# Patient Record
Sex: Female | Born: 1990
Health system: Southern US, Community
[De-identification: ages and names within clinical notes are randomized; demographics above are authoritative.]

## PROBLEM LIST (undated history)

## (undated) ENCOUNTER — Inpatient Hospital Stay: Payer: Self-pay

## (undated) DIAGNOSIS — D649 Anemia, unspecified: Secondary | ICD-10-CM

## (undated) HISTORY — PX: GALLBLADDER SURGERY: SHX652

---
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
## 2008-03-25 ENCOUNTER — Ambulatory Visit: Payer: Self-pay

## 2008-03-26 ENCOUNTER — Other Ambulatory Visit: Payer: Self-pay

## 2008-03-26 ENCOUNTER — Inpatient Hospital Stay: Payer: Self-pay

## 2009-01-27 ENCOUNTER — Emergency Department: Payer: Self-pay | Admitting: Emergency Medicine

## 2009-12-31 ENCOUNTER — Emergency Department: Payer: Self-pay | Admitting: Emergency Medicine

## 2012-04-23 ENCOUNTER — Emergency Department: Payer: Self-pay | Admitting: *Deleted

## 2012-04-23 LAB — COMPREHENSIVE METABOLIC PANEL
Alkaline Phosphatase: 36 U/L — ABNORMAL LOW (ref 50–136)
BUN: 17 mg/dL (ref 7–18)
Chloride: 106 mmol/L (ref 98–107)
Creatinine: 0.73 mg/dL (ref 0.60–1.30)
EGFR (African American): >60
EGFR (Non-African Amer.): >60
Glucose: 85 mg/dL (ref 65–99)
SGOT(AST): 14 U/L — ABNORMAL LOW (ref 15–37)
SGPT (ALT): 11 U/L — ABNORMAL LOW (ref 12–78)
Total Protein: 7.8 g/dL (ref 6.4–8.2)

## 2012-04-23 LAB — URINALYSIS, COMPLETE
Bacteria: NONE SEEN
Bilirubin,UR: NEGATIVE
Blood: NEGATIVE
Glucose,UR: NEGATIVE mg/dL (ref 0–75)
Leukocyte Esterase: NEGATIVE
Nitrite: NEGATIVE
Protein: NEGATIVE
RBC,UR: <1 /HPF (ref 0–5)
Squamous Epithelial: 1
WBC UR: 1 /HPF (ref 0–5)

## 2012-04-23 LAB — CBC
HGB: 12.4 g/dL (ref 12.0–16.0)
RBC: 4.2 10*6/uL (ref 3.80–5.20)
RDW: 12.5 % (ref 11.5–14.5)
WBC: 9.7 10*3/uL (ref 3.6–11.0)

## 2013-12-30 ENCOUNTER — Emergency Department: Payer: Self-pay | Admitting: Emergency Medicine

## 2014-09-06 ENCOUNTER — Emergency Department: Payer: Self-pay | Admitting: Internal Medicine

## 2015-04-28 LAB — OB RESULTS CONSOLE HIV ANTIBODY (ROUTINE TESTING): HIV: NONREACTIVE

## 2015-04-28 LAB — OB RESULTS CONSOLE RUBELLA ANTIBODY, IGM: Rubella: IMMUNE

## 2015-04-28 LAB — OB RESULTS CONSOLE HEPATITIS B SURFACE ANTIGEN: HEP B S AG: NEGATIVE

## 2015-04-28 LAB — OB RESULTS CONSOLE RPR: RPR: NONREACTIVE

## 2015-04-28 LAB — OB RESULTS CONSOLE ABO/RH: RH Type: POSITIVE

## 2015-04-28 LAB — OB RESULTS CONSOLE VARICELLA ZOSTER ANTIBODY, IGG: VARICELLA IGG: IMMUNE

## 2015-05-06 ENCOUNTER — Ambulatory Visit
Admission: RE | Admit: 2015-05-06 | Discharge: 2015-05-06 | Disposition: A | Discharge status: Home / Self Care | Payer: 59 | Source: Ambulatory Visit | Attending: Maternal & Fetal Medicine | Admitting: Maternal & Fetal Medicine

## 2015-05-06 DIAGNOSIS — Z8279 Family history of other congenital malformations, deformations and chromosomal abnormalities: Secondary | ICD-10-CM | POA: Diagnosis not present

## 2015-05-06 NOTE — Progress Notes (Signed)
I was available for collaboration and agree with the assessment and plan as outlined.

## 2015-05-06 NOTE — Progress Notes (Addendum)
Referring physician:  [facility] 40 minute consultation  Deanna Vaughn was referred to [facility] [place] for genetic counseling due to the history of a previous child with anencephaly.  This letter is a summary of our discussion and may be helpful if questions arise.  We first obtained a detailed family history and pregnancy history.  This is the third pregnancy for this patient and her partner.  Her first pregnancy resulted in the term stillbirth of a son, Deanna Vaughn, with anencephaly in 2007.  She reports that this was diagnosed prenatally by ultrasound at [facility] after an abnormal blood test.  She elected to continue the pregnancy.  She stated that no genetic testing and no autopsy were performed at the time of his birth and that he had no other birth differences noted.  The couple has a healthy daughter who was born via c-section due to cervical dystocia in 2009. There is no other family history of neural tube defects. Also in the family history, Deanna Vaughn reported that her Vaughn has a son with seizures.  He is two years old and there is no known cause for his condition.  We discussed that most cases of seizures are idiopathic, with no known cause. Some cases are the result of genetic syndromes which may include distinctive birthmarks or tumors under the skin as well as developmental delays.  None of these features were reported to suggest a genetic cause that would significantly increase the risks to this pregnancy to have a similar condition.  She also reported that the father of the pregnancy has a maternal first cousin with congenital blindness. There may be various causes for vision loss.  Without additional medical information about the cause for her condition, a recurrence risk assessment for other family members is difficult.  Similarly, Deanna Vaughn had a baby to pass away at term.  Our patient is not aware of the cause for this loss.  We are happy to review this history  further if more information becomes available.  There were no other family members with known birth defects, developmental delays or genetic conditions.  Deanna Vaughn reported no complications or exposures that would be expected to increase the risk for birth defects in this pregnancy.  She was taking a women's multivitamin prior to pregnancy and was given supplemental folic acid at her OB visit at [redacted] weeks gestation.   We then discussed anencephaly in more detail. Anencephaly is a severe form of an open neural tube defect.  In embryological development, the neural tube is the precursor to the fetal nervous system including the brain and spinal cord. The neural tube begins as a flat plate, then folds and closes to become a tubular structure. This process is completed around 4 weeks after conception. Sometimes, an error can occur in the closure of the neural tube, which can result in an open neural tube defect (ONTD). Anencephaly and spina bifida are examples of ONTDs. These birth defects are relatively common and can be found in approximately 1 in 500 livebirths in the general population. Anencephaly describes when the opening occurs in the fetal head, which is associated with a very poor prognosis. Babies with acrania or anencephaly typically are stillborn or pass away very shortly after birth. Anencephaly is most often an isolated condition, and typically, is not a feature of a sporadic or inherited genetic condition or chromosome abnormality.  Spina bifida describes an ONTD where the opening occurs anywhere along the spine. Depending on the severity,  spina bifida may affect a baby in different ways. All babies with spina bifida require surgery after birth to close this opening. In some individuals, the ability to walk and/or control bladder and bowel function may be compromised, while others may have no lasting impairment. Other features that may be seen with spina bifida include hydrocephalus (excess of fluid  on the brain) which may require shunt surgery after birth, learning delays, or club foot. It is not possible to predict how mildly or severely an individual will be affected, although defects located higher up on the spine have been associated with a more severe prognosis.  Most open neural tube defects are thought to be due to multiple genetic and environmental or other factors in combination, referred to as multifactorial inheritance.  Some cases are the result of genetic syndromes or chromosome conditions in the pregnancy.  In the absence of a known syndrome or chromosome conditions, we would estimate that the recurrence risk for an ONTD (spina bifida, anencephaly) in each future pregnancy conceived together would be approximately 3-5%. Once a woman has had one pregnancy with an ONTD, it is recommended that she take 4 milligrams of folic acid per day for at least 2 months prior to conception and through the first trimester of pregnancy in order to minimize the chance for an ONTD. Each pregnancy in the general population has a baseline 3% chance for any type of structural birth defect or mental retardation.  There were 2 non-invasive testing options available to assess for the chance for a neural tube defect in this pregnancy.  These are maternal serum AFP screening and level 2 ultrasound.    Maternal serum AFP screening is a maternal blood test which measures the level of alpha-fetoprotein made by the pregnancy.  It can be performed between 15 and [redacted] weeks gestation.  Based upon the level of this protein, we can determine the chance for the baby to have a neural tube defect.  Specifically, the test detects up to 80% of babies with open neural tube defects.  This is a screening tool and cannot definitively diagnose or rule out these conditions, but can give a risk assessment, with additional diagnostic testing being available if there is an increased risk.    Ultrasound uses sound waves to create a picture  of the developing baby.  This allows for examination of the fetal anatomy, including the brain and spine, which may show differences in a baby with a neural tube defect.  Those differences include an opening on the spine, excess fluid in the brain, or a change in shape of structures within the brain.  If clear images of the brain and spine are obtained, a level 2 ultrasound may detect up to 85% or more of babies with neural tube defects.  This is recommended after [redacted] weeks gestation.  As a part of routine screening for this pregnancy, we reviewed that there are screening tests including first trimester screening and maternal serum screening for aneuploidy.  Deanna Vaughn had already declined this screening at [facility] and stated today that she does not desire this testing and has no additional questions about it.  She felt it may make her more anxious about the health of the pregnancy.  We recommend an ultrasound at approximately [redacted] weeks gestation to assess for normal growth and first trimester development followed by a detailed anatomy ultrasound after 17 weeks and AFP testing in the second trimester.  We are happy to schedule the ultrasounds at [facility] if desired.  We encouraged the patient to contact us at [phone] with any questions.   Deanna Anderson, MS, CGC  Addendum:  The patient is of African American ancestry and we would therefore recommend screening for sickle cell trait and other hemoglobinopathies if this has not previously been performed as part of her new OB labs.

## 2015-06-02 ENCOUNTER — Other Ambulatory Visit: Payer: Self-pay | Admitting: Obstetrics and Gynecology

## 2015-06-02 DIAGNOSIS — Z3689 Encounter for other specified antenatal screening: Secondary | ICD-10-CM

## 2015-07-01 ENCOUNTER — Ambulatory Visit: Payer: 59

## 2015-07-01 ENCOUNTER — Ambulatory Visit
Admission: RE | Admit: 2015-07-01 | Discharge: 2015-07-01 | Disposition: A | Discharge status: Home / Self Care | Payer: 59 | Source: Ambulatory Visit | Attending: Obstetrics and Gynecology | Admitting: Obstetrics and Gynecology

## 2015-07-01 VITALS — BP 117/75 | HR 94 | Temp 97.9°F | Resp 18 | Ht 60.0 in | Wt 172.2 lb

## 2015-07-01 DIAGNOSIS — Z8279 Family history of other congenital malformations, deformations and chromosomal abnormalities: Secondary | ICD-10-CM

## 2015-07-01 DIAGNOSIS — Z36 Encounter for antenatal screening of mother: Secondary | ICD-10-CM | POA: Diagnosis present

## 2015-07-01 DIAGNOSIS — Z3689 Encounter for other specified antenatal screening: Secondary | ICD-10-CM

## 2015-08-09 ENCOUNTER — Observation Stay
Admission: EM | Admit: 2015-08-09 | Discharge: 2015-08-09 | Disposition: A | Discharge status: Home / Self Care | Payer: 59 | Attending: Obstetrics and Gynecology | Admitting: Obstetrics and Gynecology

## 2015-08-09 DIAGNOSIS — Z3A24 24 weeks gestation of pregnancy: Secondary | ICD-10-CM | POA: Diagnosis not present

## 2015-08-09 DIAGNOSIS — O26899 Other specified pregnancy related conditions, unspecified trimester: Secondary | ICD-10-CM

## 2015-08-09 DIAGNOSIS — R109 Unspecified abdominal pain: Secondary | ICD-10-CM | POA: Insufficient documentation

## 2015-08-09 DIAGNOSIS — O26892 Other specified pregnancy related conditions, second trimester: Principal | ICD-10-CM | POA: Insufficient documentation

## 2015-08-09 LAB — URINALYSIS COMPLETE WITH MICROSCOPIC (ARMC ONLY)
BACTERIA UA: NONE SEEN
Bilirubin Urine: NEGATIVE
Glucose, UA: NEGATIVE mg/dL
HGB URINE DIPSTICK: NEGATIVE
KETONES UR: NEGATIVE mg/dL
Leukocytes, UA: NEGATIVE
NITRITE: NEGATIVE
PH: 6 (ref 5.0–8.0)
PROTEIN: NEGATIVE mg/dL
Specific Gravity, Urine: 1.027 (ref 1.005–1.030)

## 2015-08-09 MED ORDER — SODIUM CHLORIDE 0.9 % IJ SOLN
INTRAMUSCULAR | Status: AC
  Filled 2015-08-09: qty 3

## 2015-08-09 MED ORDER — CALCIUM CARBONATE ANTACID 500 MG PO CHEW: 2.0000 | CHEWABLE_TABLET | ORAL | Status: DC | PRN

## 2015-08-09 MED ORDER — ACETAMINOPHEN 325 MG PO TABS: 650.0000 mg | ORAL_TABLET | ORAL | Status: DC | PRN

## 2015-08-09 NOTE — Plan of Care (Signed)
Pt presents to l/d with c/o left sided abdominal pain x 2 days. Pt states she had a csection with her first delivery. 2nd pregnancy ended with full term IUFD. EFM applied.

## 2015-08-09 NOTE — Discharge Summary (Signed)
Obstetric History and Physical  Tera MaterKeona M Vaughn is a 25 y.o. G3P2001 with Estimated Date of Delivery: 11/26/15 per LMP and 10 wk US who presents at 2385w3d  presenting for left abdominal pain. Also reporting vaginal discharge with pain. Denies trauma or recent fall. Denies constipation, diarrhea or nausea. Patient states she has been having no contractions, no vaginal bleeding, intact membranes, with active fetal movement.    Prenatal Course Source of Care: [facility]  with onset of care at 10 weeks Pregnancy complications or risks: Patient Active Problem List   Diagnosis Date Noted  . Abdominal pain affecting pregnancy, antepartum 08/09/2015  . Family history of anencephaly 05/06/2015      Prenatal Transfer Tool   Past Medical History  Diagnosis Date  . Medical history non-contributory     Past Surgical History  Procedure Laterality Date  . Cesarean section  2009    OB History  Gravida Para Term Preterm AB SAB TAB Ectopic Multiple Living  3 2 2       1     # Outcome Date GA Lbr Len/2nd Weight Sex Delivery Anes PTL Lv  3 Current           2 Term 03/26/08    F CS-LTranv     1 Term 04/27/06    Judie PetitM Vag-Vacuum   ND     Complications: Anencephaly      Social History   Social History  . Marital Status: Single    Spouse Name: N/A  . Number of Children: N/A  . Years of Education: N/A   Social History Main Topics  . Smoking status: Never Smoker   . Smokeless tobacco: Never Used  . Alcohol Use: No  . Drug Use: No  . Sexual Activity: Yes   Other Topics Concern  . Not on file   Social History Narrative    No family history on file.  Prescriptions prior to admission  Medication Sig Dispense Refill Last Dose  . Prenatal Vit-Fe Fumarate-FA (MTERYTI PO) Take 1 tablet by mouth 2 (two) times daily.   Taking    No Known Allergies  Review of Systems: Negative except for what is mentioned in HPI.  Physical Exam: BP 111/69 mmHg  Pulse 91  Temp(Src) 98.6 F (37 C)  LMP  02/19/2015 GENERAL: Well-developed, well-nourished female in no acute distress.  ABDOMEN: Soft, nontender, nondistended, gravid. EXTREMITIES: Nontender, no edema Cervical Exam: deferred FHT: Baseline rate 150 bpm   Variability moderate  Accelerations present   Decelerations none Contractions: none   Pertinent Labs/Studies:   Results for orders placed or performed during the hospital encounter of 08/09/15 (from the past 24 hour(s))  Urinalysis complete, with microscopic ([facility] only)     Status: Abnormal   Collection Time: 08/09/15  9:28 PM  Result Value Ref Range   Color, Urine YELLOW (A) YELLOW   APPearance CLEAR (A) CLEAR   Glucose, UA NEGATIVE NEGATIVE mg/dL   Bilirubin Urine NEGATIVE NEGATIVE   Ketones, ur NEGATIVE NEGATIVE mg/dL   Specific Gravity, Urine 1.027 1.005 - 1.030   Hgb urine dipstick NEGATIVE NEGATIVE   pH 6.0 5.0 - 8.0   Protein, ur NEGATIVE NEGATIVE mg/dL   Nitrite NEGATIVE NEGATIVE   Leukocytes, UA NEGATIVE NEGATIVE   RBC / HPF 0-5 0 - 5 RBC/hpf   WBC, UA 0-5 0 - 5 WBC/hpf   Bacteria, UA NONE SEEN NONE SEEN   Squamous Epithelial / LPF 0-5 (A) NONE SEEN   Mucous PRESENT  Assessment : IUP at [redacted]w[redacted]d with musculoskeletal pain  Plan: Discharge Home  Pain relief methods reviewed, pt declines Rx for Flexeril F/u at next office visit in approx 2 wks per pt

## 2015-10-29 LAB — OB RESULTS CONSOLE GC/CHLAMYDIA
Chlamydia: NEGATIVE
Gonorrhea: NEGATIVE

## 2015-10-29 LAB — OB RESULTS CONSOLE GBS: GBS: POSITIVE

## 2015-11-17 ENCOUNTER — Encounter
Admission: RE | Admit: 2015-11-17 | Discharge: 2015-11-17 | Disposition: A | Discharge status: Home / Self Care | Payer: Commercial Managed Care - HMO | Source: Ambulatory Visit | Attending: Obstetrics & Gynecology | Admitting: Obstetrics & Gynecology

## 2015-11-17 HISTORY — DX: Anemia, unspecified: D64.9

## 2015-11-17 LAB — DIFFERENTIAL
BASOS ABS: 0 10*3/uL (ref 0–0.1)
BASOS PCT: 0 %
EOS ABS: 0.1 10*3/uL (ref 0–0.7)
Eosinophils Relative: 2 %
Lymphocytes Relative: 14 %
Lymphs Abs: 1.1 10*3/uL (ref 1.0–3.6)
MONO ABS: 0.4 10*3/uL (ref 0.2–0.9)
Monocytes Relative: 5 %
NEUTROS ABS: 6.2 10*3/uL (ref 1.4–6.5)
Neutrophils Relative %: 79 %

## 2015-11-17 LAB — CBC
HCT: 30.2 % — ABNORMAL LOW (ref 35.0–47.0)
Hemoglobin: 10.2 g/dL — ABNORMAL LOW (ref 12.0–16.0)
MCH: 27.8 pg (ref 26.0–34.0)
MCHC: 33.8 g/dL (ref 32.0–36.0)
MCV: 82.3 fL (ref 80.0–100.0)
PLATELETS: 143 10*3/uL — AB (ref 150–440)
RBC: 3.67 MIL/uL — ABNORMAL LOW (ref 3.80–5.20)
RDW: 13.7 % (ref 11.5–14.5)
WBC: 7.8 10*3/uL (ref 3.6–11.0)

## 2015-11-17 LAB — ABO/RH: ABO/RH(D): B POS

## 2015-11-17 NOTE — Patient Instructions (Signed)
  Your procedure is scheduled on: Dec 18, 2015 (Thursday) Report to EMERGENCY DEPATMENT To find out your arrival time please call [phone] between 1PM - 3PM on ARRIVAL TIME 5:30 AM.  Remember: Instructions that are not followed completely may result in serious medical risk, up to and including death, or upon the discretion of your surgeon and anesthesiologist your surgery may need to be rescheduled.    __x__ 1. Do not eat food or drink liquids after midnight. No gum chewing or hard candies.     ____ 2. No Alcohol for 24 hours before or after surgery.   ____ 3. Bring all medications with you on the day of surgery if instructed.    __x__ 4. Notify your doctor if there is any change in your medical condition     (cold, fever, infections).     Do not wear jewelry, make-up, hairpins, clips or nail polish.  Do not wear lotions, powders, or perfumes. You may wear deodorant.  Do not shave 48 hours prior to surgery. Men may shave face and neck.  Do not bring valuables to the hospital.    [facility] is not responsible for any belongings or valuables.               Contacts, dentures or bridgework may not be worn into surgery.  Leave your suitcase in the car. After surgery it may be brought to your room.  For patients admitted to the hospital, discharge time is determined by your                treatment team.   Patients discharged the day of surgery will not be allowed to drive home.   Please read over the following fact sheets that you were given:   Surgical Site Infection Prevention   ____ Take these medicines the morning of surgery with A SIP OF WATER:    1.   2.   3.   4.  5.  6.  ____ Fleet Enema (as directed)   _x___ Use CHG Soap as directed (SAGE WIPES)  ____ Use inhalers on the day of surgery  ____ Stop metformin 2 days prior to surgery    ____ Take 1/2 of usual insulin dose the night before surgery and none on the morning of surgery.   _x___ Stop  Coumadin/Plavix/aspirin on (N/A)  __x__ Stop Anti-inflammatories on (NO NSAIDS)   ____ Stop supplements until after surgery.    ____ Bring C-Pap to the hospital.

## 2015-11-18 ENCOUNTER — Inpatient Hospital Stay: Payer: Commercial Managed Care - HMO | Admitting: Anesthesiology

## 2015-11-18 ENCOUNTER — Encounter
Admission: RE | Disposition: A | Discharge status: Home / Self Care | Payer: Self-pay | Source: Ambulatory Visit | Attending: Obstetrics & Gynecology

## 2015-11-18 ENCOUNTER — Inpatient Hospital Stay
Admission: RE | Admit: 2015-11-18 | Discharge: 2015-11-21 | DRG: 765 | Disposition: A | Discharge status: Home / Self Care | Payer: Commercial Managed Care - HMO | Source: Ambulatory Visit | Attending: Obstetrics & Gynecology | Admitting: Obstetrics & Gynecology

## 2015-11-18 DIAGNOSIS — D509 Iron deficiency anemia, unspecified: Secondary | ICD-10-CM | POA: Diagnosis present

## 2015-11-18 DIAGNOSIS — Z3A38 38 weeks gestation of pregnancy: Secondary | ICD-10-CM

## 2015-11-18 DIAGNOSIS — K660 Peritoneal adhesions (postprocedural) (postinfection): Secondary | ICD-10-CM | POA: Diagnosis present

## 2015-11-18 DIAGNOSIS — O34211 Maternal care for low transverse scar from previous cesarean delivery: Secondary | ICD-10-CM | POA: Diagnosis present

## 2015-11-18 DIAGNOSIS — O9902 Anemia complicating childbirth: Secondary | ICD-10-CM | POA: Diagnosis present

## 2015-11-18 DIAGNOSIS — D62 Acute posthemorrhagic anemia: Secondary | ICD-10-CM | POA: Diagnosis not present

## 2015-11-18 DIAGNOSIS — Z98891 History of uterine scar from previous surgery: Secondary | ICD-10-CM

## 2015-11-18 LAB — CBC
HCT: 23.1 % — ABNORMAL LOW (ref 35.0–47.0)
HCT: 26.5 % — ABNORMAL LOW (ref 35.0–47.0)
Hemoglobin: 7.7 g/dL — ABNORMAL LOW (ref 12.0–16.0)
Hemoglobin: 8.6 g/dL — ABNORMAL LOW (ref 12.0–16.0)
MCH: 27.8 pg (ref 26.0–34.0)
MCH: 26.5 pg (ref 26.0–34.0)
MCHC: 33.5 g/dL (ref 32.0–36.0)
MCHC: 32.5 g/dL (ref 32.0–36.0)
MCV: 82.9 fL (ref 80.0–100.0)
MCV: 81.4 fL (ref 80.0–100.0)
PLATELETS: 143 10*3/uL — AB (ref 150–440)
PLATELETS: 149 10*3/uL — AB (ref 150–440)
RBC: 2.79 MIL/uL — ABNORMAL LOW (ref 3.80–5.20)
RBC: 3.25 MIL/uL — ABNORMAL LOW (ref 3.80–5.20)
RDW: 13.4 % (ref 11.5–14.5)
RDW: 14.3 % (ref 11.5–14.5)
WBC: 10.6 10*3/uL (ref 3.6–11.0)
WBC: 15.1 10*3/uL — ABNORMAL HIGH (ref 3.6–11.0)

## 2015-11-18 LAB — PREPARE RBC (CROSSMATCH)

## 2015-11-18 LAB — RPR: RPR Ser Ql: NONREACTIVE

## 2015-11-18 LAB — HIV ANTIBODY (ROUTINE TESTING W REFLEX): HIV Screen 4th Generation wRfx: NONREACTIVE

## 2015-11-18 SURGERY — Surgical Case
Anesthesia: Spinal

## 2015-11-18 MED ORDER — FENTANYL CITRATE (PF) 100 MCG/2ML IJ SOLN: 25.0000 ug | INTRAMUSCULAR | Status: DC | PRN

## 2015-11-18 MED ORDER — BUPIVACAINE 0.25 % ON-Q PUMP DUAL CATH 400 ML
INJECTION | Status: AC
  Filled 2015-11-18: qty 400

## 2015-11-18 MED ORDER — OXYCODONE-ACETAMINOPHEN 5-325 MG PO TABS
1.0000 | ORAL_TABLET | ORAL | Status: DC | PRN
  Administered 2015-11-19 – 2015-11-21 (×2): 1 via ORAL
  Filled 2015-11-18: qty 1

## 2015-11-18 MED ORDER — CHLORHEXIDINE GLUCONATE CLOTH 2 % EX PADS
6.0000 | MEDICATED_PAD | Freq: Every day | CUTANEOUS | Status: DC
  Administered 2015-11-18: 6 via TOPICAL

## 2015-11-18 MED ORDER — PHENYLEPHRINE HCL 10 MG/ML IJ SOLN
INTRAMUSCULAR | Status: DC | PRN
  Administered 2015-11-18: 100 ug via INTRAVENOUS
  Administered 2015-11-18: 50 ug via INTRAVENOUS
  Administered 2015-11-18: 150 ug via INTRAVENOUS
  Administered 2015-11-18: 50 ug via INTRAVENOUS
  Administered 2015-11-18: 100 ug via INTRAVENOUS
  Administered 2015-11-18: 200 ug via INTRAVENOUS
  Administered 2015-11-18 (×4): 100 ug via INTRAVENOUS
  Administered 2015-11-18: 50 ug via INTRAVENOUS

## 2015-11-18 MED ORDER — LACTATED RINGERS IV SOLN
INTRAVENOUS | Status: DC
  Administered 2015-11-19 (×2): via INTRAVENOUS

## 2015-11-18 MED ORDER — CITRIC ACID-SODIUM CITRATE 334-500 MG/5ML PO SOLN
30.0000 mL | Freq: Once | ORAL | Status: AC
  Administered 2015-11-18: 30 mL via ORAL

## 2015-11-18 MED ORDER — FUROSEMIDE 10 MG/ML IJ SOLN: 20.0000 mg | Freq: Once | INTRAMUSCULAR | Status: DC

## 2015-11-18 MED ORDER — DIPHENHYDRAMINE HCL 50 MG/ML IJ SOLN
INTRAMUSCULAR | Status: DC | PRN
  Administered 2015-11-18: 12.5 mg via INTRAVENOUS

## 2015-11-18 MED ORDER — KETOROLAC TROMETHAMINE 30 MG/ML IJ SOLN
30.0000 mg | Freq: Four times a day (QID) | INTRAMUSCULAR | Status: DC | PRN
  Filled 2015-11-18: qty 1

## 2015-11-18 MED ORDER — SODIUM CHLORIDE FLUSH 0.9 % IV SOLN
INTRAVENOUS | Status: AC
  Filled 2015-11-18: qty 20

## 2015-11-18 MED ORDER — DIPHENHYDRAMINE HCL 25 MG PO CAPS: 25.0000 mg | ORAL_CAPSULE | Freq: Four times a day (QID) | ORAL | Status: DC | PRN

## 2015-11-18 MED ORDER — OXYTOCIN 10 UNIT/ML IJ SOLN: 2.5000 [IU]/h | INTRAMUSCULAR | Status: DC

## 2015-11-18 MED ORDER — NALOXONE HCL 2 MG/2ML IJ SOSY
1.0000 ug/kg/h | PREFILLED_SYRINGE | INTRAMUSCULAR | Status: DC | PRN
  Filled 2015-11-18: qty 2

## 2015-11-18 MED ORDER — SODIUM CHLORIDE 0.9 % IV SOLN
Freq: Once | INTRAVENOUS | Status: AC
  Administered 2015-11-18: 10:00:00 via INTRAVENOUS

## 2015-11-18 MED ORDER — DIBUCAINE 1 % RE OINT: 1.0000 "application " | TOPICAL_OINTMENT | RECTAL | Status: DC | PRN

## 2015-11-18 MED ORDER — ALBUMIN HUMAN 5 % IV SOLN
INTRAVENOUS | Status: DC | PRN
  Administered 2015-11-18: 09:00:00 via INTRAVENOUS

## 2015-11-18 MED ORDER — BUPIVACAINE IN DEXTROSE 0.75-8.25 % IT SOLN
INTRATHECAL | Status: DC | PRN
  Administered 2015-11-18: 12 mg via INTRATHECAL

## 2015-11-18 MED ORDER — ZOLPIDEM TARTRATE 5 MG PO TABS: 5.0000 mg | ORAL_TABLET | Freq: Every evening | ORAL | Status: DC | PRN

## 2015-11-18 MED ORDER — SIMETHICONE 80 MG PO CHEW: 80.0000 mg | CHEWABLE_TABLET | ORAL | Status: DC | PRN

## 2015-11-18 MED ORDER — NALBUPHINE HCL 10 MG/ML IJ SOLN: 5.0000 mg | INTRAMUSCULAR | Status: DC | PRN

## 2015-11-18 MED ORDER — LACTATED RINGERS IV SOLN
INTRAVENOUS | Status: DC
  Administered 2015-11-18 (×3): via INTRAVENOUS

## 2015-11-18 MED ORDER — MORPHINE SULFATE (PF) 10 MG/ML IV SOLN
INTRAVENOUS | Status: DC | PRN
  Administered 2015-11-18: .2 mg via BUCCAL

## 2015-11-18 MED ORDER — FENTANYL CITRATE (PF) 100 MCG/2ML IJ SOLN
INTRAMUSCULAR | Status: DC | PRN
  Administered 2015-11-18 (×2): 25 ug via INTRAVENOUS

## 2015-11-18 MED ORDER — OXYTOCIN 40 UNITS IN LACTATED RINGERS INFUSION - SIMPLE MED
INTRAVENOUS | Status: AC
  Administered 2015-11-18 (×3): 200 mL via INTRAVENOUS
  Administered 2015-11-18: 100 mL via INTRAVENOUS
  Administered 2015-11-18: 200 mL via INTRAVENOUS
  Administered 2015-11-18: 100 mL via INTRAVENOUS
  Filled 2015-11-18: qty 1000

## 2015-11-18 MED ORDER — SCOPOLAMINE 1 MG/3DAYS TD PT72
1.0000 | MEDICATED_PATCH | Freq: Once | TRANSDERMAL | Status: DC
  Administered 2015-11-18: 1.5 mg via TRANSDERMAL
  Filled 2015-11-18: qty 1

## 2015-11-18 MED ORDER — KETOROLAC TROMETHAMINE 30 MG/ML IJ SOLN
30.0000 mg | Freq: Four times a day (QID) | INTRAMUSCULAR | Status: DC
  Administered 2015-11-18 – 2015-11-19 (×3): 30 mg via INTRAVENOUS
  Filled 2015-11-18 (×3): qty 1

## 2015-11-18 MED ORDER — ONDANSETRON HCL 4 MG/2ML IJ SOLN
INTRAMUSCULAR | Status: DC | PRN
  Administered 2015-11-18: 4 mg via INTRAVENOUS

## 2015-11-18 MED ORDER — ACETAMINOPHEN 325 MG PO TABS: 650.0000 mg | ORAL_TABLET | ORAL | Status: DC | PRN

## 2015-11-18 MED ORDER — KETOROLAC TROMETHAMINE 30 MG/ML IJ SOLN
INTRAMUSCULAR | Status: DC | PRN
  Administered 2015-11-18: 30 mg via INTRAVENOUS

## 2015-11-18 MED ORDER — BUPIVACAINE HCL (PF) 0.5 % IJ SOLN: 10.0000 mL | Freq: Once | INTRAMUSCULAR | Status: DC

## 2015-11-18 MED ORDER — MENTHOL 3 MG MT LOZG
1.0000 | LOZENGE | OROMUCOSAL | Status: DC | PRN
  Filled 2015-11-18: qty 9

## 2015-11-18 MED ORDER — LACTATED RINGERS IV SOLN
Freq: Once | INTRAVENOUS | Status: AC
  Administered 2015-11-18: 06:00:00 via INTRAVENOUS

## 2015-11-18 MED ORDER — GLYCOPYRROLATE 0.2 MG/ML IJ SOLN
INTRAMUSCULAR | Status: DC | PRN
  Administered 2015-11-18: 0.2 mg via INTRAVENOUS

## 2015-11-18 MED ORDER — PHENYLEPHRINE 8 MG IN D5W 100 ML (0.08MG/ML) PREMIX OPTIME: INJECTION | INTRAVENOUS | Status: DC | PRN

## 2015-11-18 MED ORDER — SENNOSIDES-DOCUSATE SODIUM 8.6-50 MG PO TABS
2.0000 | ORAL_TABLET | ORAL | Status: DC
  Filled 2015-11-18: qty 2

## 2015-11-18 MED ORDER — ALBUMIN HUMAN 5 % IV SOLN
INTRAVENOUS | Status: AC
  Filled 2015-11-18: qty 250

## 2015-11-18 MED ORDER — SIMETHICONE 80 MG PO CHEW
80.0000 mg | CHEWABLE_TABLET | ORAL | Status: DC
  Filled 2015-11-18: qty 1

## 2015-11-18 MED ORDER — ONDANSETRON HCL 4 MG/2ML IJ SOLN
INTRAMUSCULAR | Status: AC
  Filled 2015-11-18: qty 2

## 2015-11-18 MED ORDER — COCONUT OIL OIL
1.0000 "application " | TOPICAL_OIL | Status: DC | PRN
  Filled 2015-11-18: qty 120

## 2015-11-18 MED ORDER — ONDANSETRON HCL 4 MG/2ML IJ SOLN
4.0000 mg | Freq: Once | INTRAMUSCULAR | Status: AC | PRN
  Administered 2015-11-18: 4 mg via INTRAVENOUS
  Filled 2015-11-18: qty 2

## 2015-11-18 MED ORDER — CEFOXITIN SODIUM-DEXTROSE 2-2.2 GM-% IV SOLR (PREMIX)
2.0000 g | Freq: Once | INTRAVENOUS | Status: AC
  Administered 2015-11-18: 2000 mg via INTRAVENOUS

## 2015-11-18 MED ORDER — BUPIVACAINE HCL (PF) 0.5 % IJ SOLN
INTRAMUSCULAR | Status: AC
Start: 2015-11-18 — End: 2015-11-18
  Filled 2015-11-18: qty 30

## 2015-11-18 MED ORDER — DEXAMETHASONE SODIUM PHOSPHATE 10 MG/ML IJ SOLN
INTRAMUSCULAR | Status: DC | PRN
  Administered 2015-11-18: 10 mg via INTRAVENOUS

## 2015-11-18 MED ORDER — CITRIC ACID-SODIUM CITRATE 334-500 MG/5ML PO SOLN
ORAL | Status: AC
  Administered 2015-11-18: 30 mL via ORAL
  Filled 2015-11-18: qty 15

## 2015-11-18 MED ORDER — EPHEDRINE SULFATE 50 MG/ML IJ SOLN
INTRAMUSCULAR | Status: DC | PRN
  Administered 2015-11-18 (×2): 10 mg via INTRAVENOUS

## 2015-11-18 MED ORDER — BUPIVACAINE HCL 0.25 % IJ SOLN
INTRAMUSCULAR | Status: DC | PRN
  Administered 2015-11-18: 10 mL

## 2015-11-18 MED ORDER — MEPERIDINE HCL 25 MG/ML IJ SOLN: 6.2500 mg | INTRAMUSCULAR | Status: DC | PRN

## 2015-11-18 MED ORDER — OXYCODONE-ACETAMINOPHEN 5-325 MG PO TABS
2.0000 | ORAL_TABLET | ORAL | Status: DC | PRN
  Filled 2015-11-18: qty 2

## 2015-11-18 MED ORDER — NALBUPHINE HCL 10 MG/ML IJ SOLN: 5.0000 mg | Freq: Once | INTRAMUSCULAR | Status: DC | PRN

## 2015-11-18 MED ORDER — PRENATAL MULTIVITAMIN CH
1.0000 | ORAL_TABLET | Freq: Every day | ORAL | Status: DC
  Administered 2015-11-20: 1 via ORAL
  Filled 2015-11-18: qty 1

## 2015-11-18 MED ORDER — CEFOXITIN SODIUM-DEXTROSE 2-2.2 GM-% IV SOLR (PREMIX)
INTRAVENOUS | Status: AC
  Administered 2015-11-18: 2000 mg via INTRAVENOUS
  Filled 2015-11-18: qty 50

## 2015-11-18 MED ORDER — SIMETHICONE 80 MG PO CHEW
80.0000 mg | CHEWABLE_TABLET | Freq: Three times a day (TID) | ORAL | Status: DC
  Administered 2015-11-19 (×2): 80 mg via ORAL
  Filled 2015-11-18: qty 1

## 2015-11-18 MED ORDER — SODIUM CHLORIDE 0.9% FLUSH: 3.0000 mL | INTRAVENOUS | Status: DC | PRN

## 2015-11-18 MED ORDER — OXYTOCIN 40 UNITS IN LACTATED RINGERS INFUSION - SIMPLE MED
INTRAVENOUS | Status: AC
  Administered 2015-11-18: 40 [IU]
  Filled 2015-11-18: qty 1000

## 2015-11-18 MED ORDER — ONDANSETRON HCL 4 MG/2ML IJ SOLN
4.0000 mg | Freq: Three times a day (TID) | INTRAMUSCULAR | Status: DC | PRN
  Administered 2015-11-18: 4 mg via INTRAVENOUS

## 2015-11-18 MED ORDER — WITCH HAZEL-GLYCERIN EX PADS: 1.0000 "application " | MEDICATED_PAD | CUTANEOUS | Status: DC | PRN

## 2015-11-18 MED ORDER — BUPIVACAINE 0.25 % ON-Q PUMP DUAL CATH 400 ML: 400.0000 mL | INJECTION | Status: DC

## 2015-11-18 MED ORDER — MORPHINE SULFATE (PF) 2 MG/ML IV SOLN: 1.0000 mg | INTRAVENOUS | Status: DC | PRN

## 2015-11-18 MED ORDER — NALOXONE HCL 0.4 MG/ML IJ SOLN: 0.4000 mg | INTRAMUSCULAR | Status: DC | PRN

## 2015-11-18 SURGICAL SUPPLY — 29 items
CANISTER SUCT 3000ML (MISCELLANEOUS) ×3 IMPLANT
CATH KIT ON-Q SILVERSOAK 5IN (CATHETERS) IMPLANT
CHLORAPREP W/TINT 26ML (MISCELLANEOUS) ×6 IMPLANT
DRESSING SURGICEL FIBRLLR 1X2 (HEMOSTASIS) ×2 IMPLANT
DRSG SURGICEL FIBRILLAR 1X2 (HEMOSTASIS) ×6
ELECT CAUTERY BLADE 6.4 (BLADE) ×3 IMPLANT
ELECT REM PT RETURN 9FT ADLT (ELECTROSURGICAL) ×3
ELECTRODE REM PT RTRN 9FT ADLT (ELECTROSURGICAL) ×1 IMPLANT
GLOVE SKINSENSE NS SZ8.0 LF (GLOVE) ×2
GLOVE SKINSENSE STRL SZ8.0 LF (GLOVE) ×1 IMPLANT
GOWN STRL REUS W/ TWL LRG LVL3 (GOWN DISPOSABLE) ×1 IMPLANT
GOWN STRL REUS W/ TWL XL LVL3 (GOWN DISPOSABLE) ×2 IMPLANT
GOWN STRL REUS W/TWL LRG LVL3 (GOWN DISPOSABLE) ×2
GOWN STRL REUS W/TWL XL LVL3 (GOWN DISPOSABLE) ×4
LIQUID BAND (GAUZE/BANDAGES/DRESSINGS) ×3 IMPLANT
NEEDLE HYPO 22GX1.5 SAFETY (NEEDLE) ×3 IMPLANT
NS IRRIG 1000ML POUR BTL (IV SOLUTION) ×3 IMPLANT
PACK C SECTION AR (MISCELLANEOUS) ×3 IMPLANT
PAD OB MATERNITY 4.3X12.25 (PERSONAL CARE ITEMS) ×3 IMPLANT
PAD PREP 24X41 OB/GYN DISP (PERSONAL CARE ITEMS) ×3 IMPLANT
SPONGE LAP 18X18 5 PK (GAUZE/BANDAGES/DRESSINGS) ×9 IMPLANT
SUT MAXON ABS #0 GS21 30IN (SUTURE) ×6 IMPLANT
SUT PLAIN 2 0 XLH (SUTURE) ×3 IMPLANT
SUT VIC AB 0 CT1 36 (SUTURE) ×6 IMPLANT
SUT VIC AB 1 CT1 36 (SUTURE) ×9 IMPLANT
SUT VIC AB 2-0 CT1 36 (SUTURE) IMPLANT
SUT VIC AB 3-0 SH 27 (SUTURE) ×2
SUT VIC AB 3-0 SH 27X BRD (SUTURE) ×1 IMPLANT
SUT VIC AB 4-0 FS2 27 (SUTURE) ×3 IMPLANT

## 2015-11-18 NOTE — Discharge Summary (Signed)
  Obstetrical Discharge Summary  Date of Admission: 11/18/2015 Date of Discharge: 11/21/2015 Discharge Diagnosis: Term Pregnancy-delivered Primary OB:  [facility]   Gestational Age at Delivery: 706w6d  Antepartum complications: none Date of Delivery: 11/21/2015  Delivered By: Deanna MajorPaul Harris, MD Delivery Type: repeat cesarean section, low transverse incision Intrapartum complications/course: None Anesthesia: spinal Placenta: manual removal Laceration: n/a Episiotomy: none Live born female  Birth Weight: 7 lb 13.9 oz (3570 g) APGAR: 5, 9  Post partum course: Patient had post operative anemia and was transfused one unit of PRBC in the PACU. Her counts stabilized after the initial drop and transfusion and during her admission, she had no s/s of anemia. When she was discharged she was meeting all PO/PP goals, including flatus  Recent Labs Lab 11/18/15 0930 11/18/15 1440 11/19/15 0527 11/20/15 0456  WBC 10.6 15.1* 9.7  --   HGB 7.7* 8.6* 6.9* 6.6*  HCT 23.1* 26.5* 20.1* 19.3*  PLT 143* 149* 139*  --     Postpartum Exam:  Current Vital Signs 24h Vital Sign Ranges  T 98.7 F (37.1 C) Temp Avg: 98.4 F (36.9 C) Min: 97.9 F (36.6 C) Max: 98.8 F (37.1 C)  BP 124/67 mmHg BP Min: 106/54 Max: 124/67  HR 82 Pulse Avg: 85 Min: 81 Max: 90  RR 18 Resp Avg: 19.5 Min: 18 Max: 20  SaO2 100 % Not Delivered SpO2 Avg: 100 % Min: 100 % Max: 100 %       24 Hour I/O Current Shift I/O  Time Ins Outs        General: NAD Abdomen: +BS, soft, NTTP, mild to moderately distended with some tympany. C/d/i with dermabond over it. FF below the umbilicus and nttp Perineum: deferred Skin: Warm and dry.  Cardiovascular: normal s1 and s2.  Respiratory: Clear to auscultation bilateral. Normal respiratory effort Extremities: no c/c/e     Disposition: home with infant Rh Immune globulin given: not applicable Rubella vaccine given: not applicable Varicella vaccine  given: not applicable Tdap vaccine given in AP or PP setting: yes Contraception: pills  B POS / Rubella Immune / Varicella Immune/ RPR negative / HIV negative / HepBsAg negative / Tdap UTD: yes pap neg 2016 / Bottle / Contraception: OCPs / Follow up: [facility]  Plan:  Deanna Vaughn was discharged to home in good condition. [facility] 1 week for an incision check  Discharge Medications:   Medication List    TAKE these medications        ferrous gluconate 324 MG tablet  Commonly known as:  FERGON  Take 1 tablet (324 mg total) by mouth 2 (two) times daily with a meal.     ibuprofen 600 MG tablet  Commonly known as:  ADVIL,MOTRIN  Take 1 tablet (600 mg total) by mouth every 6 (six) hours as needed for moderate pain.     MTERYTI PO  Take 1 tablet by mouth 2 (two) times daily.     oxyCODONE-acetaminophen 5-325 MG tablet  Commonly known as:  PERCOCET/ROXICET  Take 1 tablet by mouth every 6 (six) hours as needed for severe pain.     polyethylene glycol packet  Commonly known as:  MIRALAX / GLYCOLAX  Take 17 g by mouth daily as needed for mild constipation or moderate constipation.        Deanna Vaughn, [name] MD [facility]  Pager: [phone]

## 2015-11-18 NOTE — Anesthesia Preprocedure Evaluation (Addendum)
Anesthesia Evaluation  Patient identified by MRN, date of birth, ID band Patient awake    Reviewed: Allergy & Precautions, NPO status , Patient's Chart, lab work & pertinent test results, reviewed documented beta blocker date and time   Airway Mallampati: II  TM Distance: >3 FB     Dental  (+) Chipped   Pulmonary           Cardiovascular      Neuro/Psych    GI/Hepatic   Endo/Other    Renal/GU      Musculoskeletal   Abdominal   Peds  Hematology  (+) anemia ,   Anesthesia Other Findings Obese. Anemic 10.2 Hb.  Reproductive/Obstetrics                            Anesthesia Physical Anesthesia Plan  ASA: II  Anesthesia Plan: Spinal   Post-op Pain Management:    Induction:   Airway Management Planned:   Additional Equipment:   Intra-op Plan:   Post-operative Plan:   Informed Consent: I have reviewed the patients History and Physical, chart, labs and discussed the procedure including the risks, benefits and alternatives for the proposed anesthesia with the patient or authorized representative who has indicated his/her understanding and acceptance.     Plan Discussed with: CRNA  Anesthesia Plan Comments:         Anesthesia Quick Evaluation

## 2015-11-18 NOTE — Anesthesia Procedure Notes (Signed)
Spinal Patient location during procedure: OR Staffing Anesthesiologist: Berdine AddisonHOMAS, Laquitta Dominski Performed by: anesthesiologist  Preanesthetic Checklist Completed: patient identified, site marked, surgical consent, pre-op evaluation, timeout performed, IV checked and risks and benefits discussed Spinal Block Patient position: sitting Prep: Betadine Patient monitoring: heart rate, cardiac monitor, continuous pulse ox and blood pressure Approach: midline Location: L3-4 Injection technique: single-shot Needle Needle type: Pencil-Tip  Needle gauge: 25 G Needle length: 9 cm Assessment Sensory level: T10 Additional Notes Marcaine 12mg  and astro 0.2mg .

## 2015-11-18 NOTE — Transfer of Care (Signed)
Immediate Anesthesia Transfer of Care Note  Patient: Deanna Vaughn  Procedure(s) Performed: Procedure(s): CESAREAN SECTION  (N/A)  Patient Location: PACU  Anesthesia Type:Spinal  Level of Consciousness: awake, alert  and oriented  Airway & Oxygen Therapy: Patient Spontanous Breathing  Post-op Assessment: Report given to RN and Post -op Vital signs reviewed and stable  Post vital signs: Reviewed and stable  Last Vitals:  Filed Vitals:   11/18/15 0551 11/18/15 0930  BP: 107/68 86/64  Pulse: 92 64  Temp: 36.8 C 35.4 C  Resp: 18 16    Complications: No apparent anesthesia complications

## 2015-11-18 NOTE — Op Note (Addendum)
Cesarean Section Procedure Note Indications: prior cesarean section and term intrauterine pregnancy  Pre-operative Diagnosis: Intrauterine pregnancy 385w1d ;  prior cesarean section and term intrauterine pregnancy Post-operative Diagnosis: same, delivered. Procedure: Low Transverse Cesarean Section Surgeon: Annamarie MajorPaul Burwell Bethel, MD, FACOG Assistant(s): Dr Vergie LivingPickens Anesthesia: Spinal anesthesia Estimated Blood Loss:750 mL Complications: None; patient tolerated the procedure well. Disposition: PACU - hemodynamically stable. Condition: stable  Findings: A female infant in the cephalic presentation. Amniotic fluid - Clear  Birth weight 7-15 lbs.  Apgars of 5 and 9.  Intact placenta with a three-vessel cord. Grossly normal uterus, tubes and ovaries bilaterally. Severe intraabdominal adhesions were noted especially from the uterus to the anterior abdominal wall.  Procedure Details   The patient was taken to Operating Room, identified as the correct patient and the procedure verified as C-Section Delivery. A Time Out was held and the above information confirmed. After induction of anesthesia, the patient was draped and prepped in the usual sterile manner. A Pfannenstiel incision was made and carried down through the subcutaneous tissue to the fascia. Fascial incision was made and extended transversely with the Mayo scissors. The fascia was separated from the underlying rectus tissue superiorly and inferiorly. The peritoneum was identified and entered bluntly. Peritoneal incision was extended longitudinally. The utero-vesical peritoneal reflection was incised transversely and a bladder flap was created digitally.  A low transverse hysterotomy was made. The fetus was delivered atraumatically. The umbilical cord was clamped x2 and cut and the infant was handed to the awaiting pediatricians. The placenta was removed intact and appeared normal with a 3-vessel cord.  The uterus was exteriorized and cleared of all  clot and debris. The hysterotomy was closed with running sutures of 0 Vicryl suture. A second imbricating layer was placed with the same suture.  Additional suture were placed to minimize bleeding from adhesive dissection.  Fibrilar placed to assist with hemostasis.   Excellent hemostasis was observed before closure. The uterus was returned to the abdomen. The pelvis was irrigated and again, excellent hemostasis was noted.  The rectus fascia was then reapproximated with running sutures of Maxon. Subcutaneous tissues are then irrigated with saline and hemostasis assured.  Skin is then closed with 4-0 vicryl suture in a subcuticular fashion followed by skin adhesive. Instrument, sponge, and needle counts were correct prior to the abdominal closure and at the conclusion of the case.  The patient tolerated the procedure well and was transferred to the recovery room in stable condition.   Addendum- gestational age adjusted to corrected 39 1/7 weeks at time of surgery PH

## 2015-11-18 NOTE — Progress Notes (Signed)
Pt in OR/Recovery with low BP for anesthesia. Re-examination revels firm fundus and no abdominal distension or rigidity.   Exam of vagina reveals no extensive clots or bleeding   Foley w clear urine   Pt alert, oriented, min pain reported.  Anesthesia to cont w volume resuscitation including one unit blood. I do not see evidence for ongoing acute internal blood loss at this time; will cont to monitor status along with anesthesia team.

## 2015-11-18 NOTE — H&P (Signed)
History and Physical Interval Note:  11/18/2015 7:10 AM  Deanna Vaughn  has presented today for surgery, with the diagnosis of TERM PREGNANCY,PRIOR CESAREAN  The various methods of treatment have been discussed with the patient and family. After consideration of risks, benefits and other options for treatment, the patient has consented to  Procedure(s): CESAREAN SECTION as a surgical intervention .  The patient's history has been reviewed, patient examined, no change in status, stable for surgery.  Pt has the following beta blocker history-  Not taking Beta Blocker.  I have reviewed the patient's chart and labs.  Questions were answered to the patient's satisfaction.    Letitia Libraobert Paul Charle Clear

## 2015-11-19 ENCOUNTER — Encounter: Payer: Self-pay | Admitting: Obstetrics & Gynecology

## 2015-11-19 LAB — TYPE AND SCREEN
ABO/RH(D): B POS
Antibody Screen: NEGATIVE
EXTEND SAMPLE REASON: UNDETERMINED
Unit division: 0

## 2015-11-19 LAB — CBC
HCT: 20.1 % — ABNORMAL LOW (ref 35.0–47.0)
Hemoglobin: 6.9 g/dL — ABNORMAL LOW (ref 12.0–16.0)
MCH: 28.1 pg (ref 26.0–34.0)
MCHC: 34.1 g/dL (ref 32.0–36.0)
MCV: 82.6 fL (ref 80.0–100.0)
PLATELETS: 139 10*3/uL — AB (ref 150–440)
RBC: 2.44 MIL/uL — ABNORMAL LOW (ref 3.80–5.20)
RDW: 14.1 % (ref 11.5–14.5)
WBC: 9.7 10*3/uL (ref 3.6–11.0)

## 2015-11-19 MED ORDER — IBUPROFEN 600 MG PO TABS
600.0000 mg | ORAL_TABLET | Freq: Four times a day (QID) | ORAL | Status: DC | PRN
  Administered 2015-11-19 – 2015-11-21 (×5): 600 mg via ORAL
  Filled 2015-11-19 (×5): qty 1

## 2015-11-19 NOTE — Progress Notes (Signed)
Notify Kim Andrews, Rn of bp 

## 2015-11-19 NOTE — Anesthesia Post-op Follow-up Note (Signed)
  Anesthesia Pain Follow-up Note  Patient: Deanna Vaughn  Day #: 1  Date of Follow-up: 11/19/2015 Time: 7:11 AM  Last Vitals:  Filed Vitals:   11/19/15 0433 11/19/15 0500  BP: 91/41 88/47  Pulse: 74 79  Temp: 37.1 C   Resp: 20     Level of Consciousness: alert  Pain: mild   Side Effects:None  Catheter Site Exam:clean  Plan: D/C from anesthesia care  [facility] Domenic Schoenberger

## 2015-11-19 NOTE — Anesthesia Postprocedure Evaluation (Signed)
Anesthesia Post Note  Patient: Deanna Vaughn  Procedure(s) Performed: Procedure(s) (LRB): CESAREAN SECTION  (N/A)  Patient location during evaluation: Mother Baby Anesthesia Type: Spinal Level of consciousness: awake, awake and alert and oriented Pain management: pain level controlled Vital Signs Assessment: post-procedure vital signs reviewed and stable Respiratory status: spontaneous breathing and nonlabored ventilation Cardiovascular status: blood pressure returned to baseline and stable Postop Assessment: no headache, no backache, patient able to bend at knees and no signs of nausea or vomiting Anesthetic complications: no    Last Vitals:  Filed Vitals:   11/19/15 0433 11/19/15 0500  BP: 91/41 88/47  Pulse: 74 79  Temp: 37.1 C   Resp: 20     Last Pain:  Filed Vitals:   11/19/15 0605  PainSc: 4                  Chiropodisttephanie Chrisann Melaragno

## 2015-11-19 NOTE — Progress Notes (Addendum)
  Post-operative Day 1  Subjective: tolerating PO and + flatus - has not been OOB yet this am, asymptomatic for anemia at this time  Objective: Blood pressure 92/38, pulse 77, temperature 98.9 F (37.2 C), temperature source Oral, resp. rate 18, height 5' (1.524 m), weight 191 lb (86.637 kg), last menstrual period 02/19/2015, SpO2 100 %,   Physical Exam:  General: alert and cooperative Lochia: appropriate Uterine Fundus: firm Incision: healing well, no significant drainage DVT Evaluation: No evidence of DVT seen on physical exam. Abdomen: soft, NT   Recent Labs  11/18/15 1440 11/19/15 0527  HGB 8.6* 6.9*  HCT 26.5* 20.1*    Assessment POD #1, repeat LTCS, blood loss and iron deficiency anemia, s/p 1 unit PRBC  Plan: Continue PO care, Advance activity as tolerated and Fe replacement, anemia precautions - evaluate for need for further need for blood transfusion once pt ambulatory  Feeding: bottle Contraception: IUD? Blood Type: B+ RI/VI TDAP UTD  *EGA noted to be 5110w6d in Epic with EDD of 11/26/15. Consulted office chart, LMP EDD was 11/24/15 which agreed with 10 week dating US. Updated EDD in Epic chart. Pt was 9547w1d at delivery.   Marta AntuBrothers, Deannah Rossi, [place] 11/19/2015, 9:44 AM

## 2015-11-20 LAB — HEMOGLOBIN AND HEMATOCRIT, BLOOD
HEMATOCRIT: 19.3 % — AB (ref 35.0–47.0)
Hemoglobin: 6.6 g/dL — ABNORMAL LOW (ref 12.0–16.0)

## 2015-11-20 MED ORDER — FERROUS GLUCONATE 324 (38 FE) MG PO TABS
324.0000 mg | ORAL_TABLET | Freq: Two times a day (BID) | ORAL | Status: DC
  Administered 2015-11-20 – 2015-11-21 (×3): 324 mg via ORAL
  Filled 2015-11-20 (×3): qty 1

## 2015-11-20 MED ORDER — SIMETHICONE 80 MG PO CHEW
80.0000 mg | CHEWABLE_TABLET | Freq: Two times a day (BID) | ORAL | Status: DC
  Administered 2015-11-20 (×2): 80 mg via ORAL
  Filled 2015-11-20 (×2): qty 1

## 2015-11-20 MED ORDER — POLYETHYLENE GLYCOL 3350 17 G PO PACK: 17.0000 g | PACK | Freq: Every day | ORAL | Status: DC

## 2015-11-20 MED ORDER — SENNOSIDES-DOCUSATE SODIUM 8.6-50 MG PO TABS: 1.0000 | ORAL_TABLET | Freq: Every evening | ORAL | Status: DC | PRN

## 2015-11-20 NOTE — Clinical Documentation Improvement (Signed)
OB/GYN  Can the diagnosis of anemia be further specified?   Acute Blood Loss Anemia, including the suspected or known cause or associated condition(s)  Acute on chronic blood loss anemia, including the suspected or known cause or associated condition(s)  Chronic blood loss anemia, including the suspected or known cause or associated condition(s)  Precipitous drop in Hematocrit, including the suspected or known cause or associated condition(s)  Other  Clinically Undetermined  Document any associated diagnoses/conditions. Please update your documentation within the medical record to reflect your response to this query. Thank you.  Supporting Information:(As per notes) "repeat LTCS, blood loss and iron deficiency anemia, s/p 1 unit PRBC"   11-19-15 HEMOGLOBIN(g/dL) 1.6X6.6L  Please exercise your independent, professional judgment when responding. A specific answer is not anticipated or expected.  Thank You, Nevin BloodgoodJoan B Tahlia Deamer, RN, BSN, CCDS,Clinical Documentation Specialist:  [phone]  [phone]=[facility] [place]- Health Information Management

## 2015-11-20 NOTE — Progress Notes (Signed)
Daily Post Partum Note  Deanna Vaughn is a 25 y.o. [id number]   POD#2 s/p rpt LTCS (EBL )  Pregnancy c/b BMI 33, h/o trich  24hr/overnight events:  none  Subjective:  Patient states she's gotten up and denies any s/s of anemia, such as chest pain, SOB, dizziness, lightheadedness. No problems with PO, pain controlled with meds. No flatus yet but no n/v.   Objective:   Filed Vitals:   11/19/15 2022 11/19/15 2304 11/20/15 0310 11/20/15 0742  BP: 110/51 101/65 106/53 92/48  Pulse: 88 86 88 83  Temp: 99.1 F (37.3 C) 98.9 F (37.2 C) 98.8 F (37.1 C) 98.7 F (37.1 C)  TempSrc: Oral Oral Oral Oral  Resp: Height:      Weight:      SpO2: 100% 100% 100% 100%    Current Vital Signs 24h Vital Sign Ranges  T 98.7 F (37.1 C) Temp  Avg: 98.7 F (37.1 C)  Min: 98.3 F (36.8 C)  Max: 99.1 F (37.3 C)  BP (!) 92/48 mmHg BP  Min: 86/47  Max: 110/51  HR 83 Pulse  Avg: 87.8  Min: 83  Max: 94  RR 18 Resp  Avg: 17.7  Min: 16  Max: 18  SaO2 100 % Not Delivered SpO2  Avg: 100 %  Min: 100 %  Max: 100 %       24 Hour I/O Current Shift I/O  Time Ins Outs 04/21 0701 - 04/22 0700 In: -  Out: 250 [Urine:250]      General: NAD Abdomen: rare BS, soft, NTTP, mild to moderately distended with some tympany. C/d/i with dermabond over it. Perineum: deferred Skin:  Warm and dry.  Cardiovascular: normal s1 and s2.  Respiratory:  Clear to auscultation bilateral. Normal respiratory effort Extremities: no c/c/e  Medications Current Facility-Administered Medications  Medication Dose Route Frequency Provider Last Rate Last Dose  . acetaminophen (TYLENOL) tablet 650 mg  650 mg Oral Q4H PRN Nadara Mustard, MD      . coconut oil  1 application Topical PRN Nadara Mustard, MD      . witch hazel-glycerin (TUCKS) pad 1 application  1 application Topical PRN Nadara Mustard, MD       And  . dibucaine (NUPERCAINAL) 1 % rectal ointment 1 application  1 application Rectal PRN Nadara Mustard, MD      . diphenhydrAMINE (BENADRYL) capsule 25 mg  25 mg Oral Q6H PRN Nadara Mustard, MD      . ferrous gluconate [facility]) tablet 324 mg  324 mg Oral BID WC Coronita Bing, MD      . ibuprofen (ADVIL,MOTRIN) tablet 600 mg  600 mg Oral Q6H PRN Marta Antu, CNM   600 mg at 11/19/15 1844  . menthol-cetylpyridinium (CEPACOL) lozenge 3 mg  1 lozenge Oral Q2H PRN Nadara Mustard, MD      . oxyCODONE-acetaminophen (PERCOCET/ROXICET) 5-325 MG per tablet 1 tablet  1 tablet Oral Q4H PRN Nadara Mustard, MD   1 tablet at 11/19/15 2127  . oxyCODONE-acetaminophen (PERCOCET/ROXICET) 5-325 MG per tablet 2 tablet  2 tablet Oral Q4H PRN Nadara Mustard, MD      . polyethylene glycol (MIRALAX / GLYCOLAX) packet 17 g  17 g Oral Daily [place] Bing, MD      . prenatal multivitamin tablet 1 tablet  1 tablet Oral Q1200 Nadara Mustard, MD      . scopolamine (TRANSDERM-SCOP) 1 MG/3DAYS  1.5 mg  1 patch Transdermal Once Berdine AddisonMathai Thomas, MD   1.5 mg at 11/18/15 2103  . senna-docusate (Senokot-S) tablet 1 tablet  1 tablet Oral QHS PRN [place] Bingharlie Sharah Finnell, MD      . simethicone (MYLICON) chewable tablet 80 mg  80 mg Oral BID AC [place] Bingharlie Smriti Barkow, MD      . zolpidem (AMBIEN) tablet 5 mg  5 mg Oral QHS PRN Nadara Mustardobert P Harris, MD        Labs:   Recent Labs Lab 11/18/15 0930 11/18/15 1440 11/19/15 0527 11/20/15 0456  WBC 10.6 15.1* 9.7  --   HGB 7.7* 8.6* 6.9* 6.6*  HCT 23.1* 26.5* 20.1* 19.3*  PLT 143* 149* 139*  --    Assessment & Plan:  Pt doing well *Postpartum/postop: routine care *Anemia: stable blood counts and no s/s. Meds adjsuted and pt put on BM regimen and iron. Pt told to try and ambulate, etc as much as possible to see if any s/s of come on. Will keep SLIV for now. -pt received 1U PRBC in the PACU *Dispo: likely tomorrow or POD#4  B POS / Rubella Immune / Varicella Immune/  RPR negative / HIV negative / HepBsAg negative / Tdap UTD: yes pap neg 2016 / Bottle  / Contraception: not d/w pt /  [facility]  Meline Russaw, Jr. MD [facility] Pager [phone]

## 2015-11-21 MED ORDER — FERROUS GLUCONATE 324 (38 FE) MG PO TABS: 324.0000 mg | ORAL_TABLET | Freq: Two times a day (BID) | ORAL | Status: DC

## 2015-11-21 MED ORDER — POLYETHYLENE GLYCOL 3350 17 G PO PACK: 17.0000 g | PACK | Freq: Every day | ORAL | Status: DC | PRN

## 2015-11-21 MED ORDER — IBUPROFEN 600 MG PO TABS: 600.0000 mg | ORAL_TABLET | Freq: Four times a day (QID) | ORAL | Status: DC | PRN

## 2015-11-21 MED ORDER — OXYCODONE-ACETAMINOPHEN 5-325 MG PO TABS: 1.0000 | ORAL_TABLET | Freq: Four times a day (QID) | ORAL | Status: DC | PRN

## 2015-11-21 NOTE — Discharge Instructions (Signed)
Cesarean Delivery, Care After  Refer to this sheet in the next few weeks. These instructions provide you with information on caring for yourself after your procedure. Your health care provider may also give you specific instructions. Your treatment has been planned according to current medical practices, but problems sometimes occur. Call your health care provider if you have any problems or questions after you go home.  HOME CARE INSTRUCTIONS   Only take over-the-counter or prescription medications as directed by your health care provider.   Do not drink alcohol, especially if you are breastfeeding or taking medication to relieve pain.   Do not chew or smoke tobacco.   Continue to use good perineal care. Good perineal care includes:    Wiping your perineum from front to back.    Keeping your perineum clean.   Check your surgical cut (incision) daily for increased redness, drainage, swelling, or separation of skin.   Clean your incision gently with soap and water every day, and then pat it dry. If your health care provider says it is okay, leave the incision uncovered. Use a bandage (dressing) if the incision is draining fluid or appears irritated. If the adhesive strips across the incision do not fall off within 7 days, carefully peel them off.   Hug a pillow when coughing or sneezing until your incision is healed. This helps to relieve pain.   Do not use tampons or douche until your health care provider says it is okay.   Shower, wash your hair, and take tub baths as directed by your health care provider.   Wear a well-fitting bra that provides breast support.   Limit wearing support panties or control-top hose.   Drink enough fluids to keep your urine clear or pale yellow.   Eat high-fiber foods such as whole grain cereals and breads, brown rice, beans, and fresh fruits and vegetables every day. These foods may help prevent or relieve constipation.   Resume activities such as climbing stairs,  driving, lifting, exercising, or traveling as directed by your health care provider.   Talk to your health care provider about resuming sexual activities. This is dependent upon your risk of infection, your rate of healing, and your comfort and desire to resume sexual activity.   Try to have someone help you with your household activities and your newborn for at least a few days after you leave the hospital.   Rest as much as possible. Try to rest or take a nap when your newborn is sleeping.   Increase your activities gradually.   Keep all of your scheduled postpartum appointments. It is very important to keep your scheduled follow-up appointments. At these appointments, your health care provider will be checking to make sure that you are healing physically and emotionally.  SEEK MEDICAL CARE IF:    You are passing large clots from your vagina. Save any clots to show your health care provider.   You have a foul smelling discharge from your vagina.   You have trouble urinating.   You are urinating frequently.   You have pain when you urinate.   You have a change in your bowel movements.   You have increasing redness, pain, or swelling near your incision.   You have pus draining from your incision.   Your incision is separating.   You have painful, hard, or reddened breasts.   You have a severe headache.   You have blurred vision or see spots.   You feel sad   or depressed.   You have thoughts of hurting yourself or your newborn.   You have questions about your care, the care of your newborn, or medications.   You are dizzy or light-headed.   You have a rash.   You have pain, redness, or swelling at the site of the removed intravenous access (IV) tube.   You have nausea or vomiting.   You stopped breastfeeding and have not had a menstrual period within 12 weeks of stopping.   You are not breastfeeding and have not had a menstrual period within 12 weeks of delivery.   You have a fever.  SEEK  IMMEDIATE MEDICAL CARE IF:   You have persistent pain.   You have chest pain.   You have shortness of breath.   You faint.   You have leg pain.   You have stomach pain.   Your vaginal bleeding saturates 2 or more sanitary pads in 1 hour.  MAKE SURE YOU:    Understand these instructions.   Will watch your condition.   Will get help right away if you are not doing well or get worse.     This information is not intended to replace advice given to you by your health care provider. Make sure you discuss any questions you have with your health care provider.     Document Released: 04/08/2002 Document Revised: 08/07/2014 Document Reviewed: 03/13/2012  Elsevier Interactive Patient Education 2016 Elsevier Inc.

## 2015-11-21 NOTE — Progress Notes (Signed)
Daily Post Partum Note  Deanna Vaughn is a 25 y.o. [id number]   POD#3 s/p rpt LTCS (EBL ).  Pregnancy c/b BMI 33, h/o trich  24hr/overnight events:  none  Subjective:  Doing well and meeting all PP/PO goals. Still no s/s of anemia. +flatus.   Objective:   Filed Vitals:   11/20/15 2001 11/20/15 2304 11/21/15 0403 11/21/15 0900  BP: 107/64 106/54 111/62 124/67  Pulse: 81 90 87 82  Temp: 98.4 F (36.9 C) 97.9 F (36.6 C) 98.3 F (36.8 C) 98.7 F (37.1 C)  TempSrc: Oral Oral Oral Oral  Resp: Height:      Weight:      SpO2: 100% 100% 100%     Current Vital Signs 24h Vital Sign Ranges  T 98.7 F (37.1 C) Temp  Avg: 98.4 F (36.9 C)  Min: 97.9 F (36.6 C)  Max: 98.8 F (37.1 C)  BP 124/67 mmHg BP  Min: 106/54  Max: 124/67  HR 82 Pulse  Avg: 85  Min: 81  Max: 90  RR 18 Resp  Avg: 19.5  Min: 18  Max: 20  SaO2 100 % Not Delivered SpO2  Avg: 100 %  Min: 100 %  Max: 100 %       24 Hour I/O Current Shift I/O  Time Ins Outs        General: NAD Abdomen: +BS, soft, NTTP, mild to moderately distended with some tympany. C/d/i with dermabond over it. FF below the umbilicus and nttp Perineum: deferred Skin:  Warm and dry.  Cardiovascular: normal s1 and s2.  Respiratory:  Clear to auscultation bilateral. Normal respiratory effort Extremities: no c/c/e  Medications Current Facility-Administered Medications  Medication Dose Route Frequency Provider Last Rate Last Dose  . acetaminophen (TYLENOL) tablet 650 mg  650 mg Oral Q4H PRN Nadara Mustard, MD      . coconut oil  1 application Topical PRN Nadara Mustard, MD      . witch hazel-glycerin (TUCKS) pad 1 application  1 application Topical PRN Nadara Mustard, MD       And  . dibucaine (NUPERCAINAL) 1 % rectal ointment 1 application  1 application Rectal PRN Nadara Mustard, MD      . diphenhydrAMINE (BENADRYL) capsule 25 mg  25 mg Oral Q6H PRN Nadara Mustard, MD      . ferrous gluconate [facility]) tablet 324 mg   324 mg Oral BID WC [place] Bing, MD   324 mg at 11/21/15 0853  . ibuprofen (ADVIL,MOTRIN) tablet 600 mg  600 mg Oral Q6H PRN Marta Antu, CNM   600 mg at 11/20/15 2304  . menthol-cetylpyridinium (CEPACOL) lozenge 3 mg  1 lozenge Oral Q2H PRN Nadara Mustard, MD      . oxyCODONE-acetaminophen (PERCOCET/ROXICET) 5-325 MG per tablet 1 tablet  1 tablet Oral Q4H PRN Nadara Mustard, MD   1 tablet at 11/19/15 2127  . oxyCODONE-acetaminophen (PERCOCET/ROXICET) 5-325 MG per tablet 2 tablet  2 tablet Oral Q4H PRN Nadara Mustard, MD      . polyethylene glycol (MIRALAX / GLYCOLAX) packet 17 g  17 g Oral Daily [place] Bing, MD   17 g at 11/20/15 0843  . prenatal multivitamin tablet 1 tablet  1 tablet Oral Q1200 Nadara Mustard, MD   1 tablet at 11/20/15 1127  . scopolamine (TRANSDERM-SCOP) 1 MG/3DAYS 1.5 mg  1 patch Transdermal Once Berdine Addison, MD   1.5 mg at  11/18/15 2103  . senna-docusate (Senokot-S) tablet 1 tablet  1 tablet Oral QHS PRN [place] Bingharlie Jeri Rawlins, MD      . simethicone (MYLICON) chewable tablet 80 mg  80 mg Oral BID AC [place] Bingharlie Terie Lear, MD   80 mg at 11/20/15 1727  . zolpidem (AMBIEN) tablet 5 mg  5 mg Oral QHS PRN Nadara Mustardobert P Harris, MD        Labs:   Recent Labs Lab 11/18/15 0930 11/18/15 1440 11/19/15 0527 11/20/15 0456  WBC 10.6 15.1* 9.7  --   HGB 7.7* 8.6* 6.9* 6.6*  HCT 23.1* 26.5* 20.1* 19.3*  PLT 143* 149* 139*  --    Assessment & Plan:  Pt doing well *Postpartum/postop: routine care *Anemia, acute blood loss due to surgery: stable blood counts and no s/s. Continue with extra iron -pt received 1U PRBC in the PACU *Dispo: today  B POS / Rubella Immune / Varicella Immune/  RPR negative / HIV negative / HepBsAg negative / Tdap UTD: yes pap neg 2016 / Bottle  / Contraception: OCPs / [facility]  Nathaneil Feagans, Jr. MD [facility] Pager [phone]

## 2016-11-07 ENCOUNTER — Other Ambulatory Visit: Payer: Self-pay | Admitting: Obstetrics & Gynecology

## 2016-11-08 NOTE — Telephone Encounter (Signed)
Called in to pharmacy bc refilled with only 3 refills then annual

## 2018-01-10 ENCOUNTER — Other Ambulatory Visit: Payer: Self-pay | Admitting: Obstetrics & Gynecology

## 2018-01-10 ENCOUNTER — Ambulatory Visit (INDEPENDENT_AMBULATORY_CARE_PROVIDER_SITE_OTHER): Payer: Medicaid Other

## 2018-01-10 ENCOUNTER — Encounter: Payer: Self-pay | Admitting: Obstetrics & Gynecology

## 2018-01-10 ENCOUNTER — Ambulatory Visit (INDEPENDENT_AMBULATORY_CARE_PROVIDER_SITE_OTHER): Payer: Medicaid Other | Admitting: Obstetrics & Gynecology

## 2018-01-10 VITALS — BP 100/60 | Wt 196.0 lb

## 2018-01-10 DIAGNOSIS — Z98891 History of uterine scar from previous surgery: Secondary | ICD-10-CM

## 2018-01-10 DIAGNOSIS — O3411 Maternal care for benign tumor of corpus uteri, first trimester: Secondary | ICD-10-CM

## 2018-01-10 DIAGNOSIS — Z3A09 9 weeks gestation of pregnancy: Secondary | ICD-10-CM | POA: Diagnosis not present

## 2018-01-10 DIAGNOSIS — N8311 Corpus luteum cyst of right ovary: Secondary | ICD-10-CM

## 2018-01-10 DIAGNOSIS — N926 Irregular menstruation, unspecified: Secondary | ICD-10-CM

## 2018-01-10 DIAGNOSIS — Z3A11 11 weeks gestation of pregnancy: Secondary | ICD-10-CM

## 2018-01-10 DIAGNOSIS — O0991 Supervision of high risk pregnancy, unspecified, first trimester: Secondary | ICD-10-CM

## 2018-01-10 DIAGNOSIS — Z8279 Family history of other congenital malformations, deformations and chromosomal abnormalities: Secondary | ICD-10-CM

## 2018-01-10 MED ORDER — DOXYLAMINE-PYRIDOXINE 10-10 MG PO TBEC: 2.0000 | DELAYED_RELEASE_TABLET | Freq: Every day | ORAL | 5 refills | Status: DC

## 2018-01-10 MED ORDER — PROVIDA DHA 16-16-1.25-110 MG PO CAPS: 1.0000 | ORAL_CAPSULE | Freq: Every day | ORAL | 11 refills | Status: DC

## 2018-01-10 NOTE — Progress Notes (Signed)
01/10/2018   Chief Complaint: Missed period  Transfer of Care Patient: no  History of Present Illness: Ms. Gotto is a 27 y.o. [id number] [redacted]w[redacted]d based on Patient's last menstrual period was 10/22/2017 (lmp unknown). with an Estimated Date of Delivery: 07/29/18, with the above CC.   Her periods were: irregular periods from 24 to 45 days She was using no method when she conceived.  She has Positive signs or symptoms of nausea/vomiting of pregnancy. She has Negative signs or symptoms of miscarriage or preterm labor She identifies Negative Zika risk factors for her and her partner On any different medications around the time she conceived/early pregnancy: No  History of varicella: Yes   ROS: A 12-point review of systems was performed and negative, except as stated in the above HPI.  OBGYN History: As per HPI. OB History  Gravida Para Term Preterm AB Living  4 3 3     2   SAB TAB Ectopic Multiple Live Births        0 3    # Outcome Date GA Lbr Len/2nd Weight Sex Delivery Anes PTL Lv  4 Current           3 Term 11/18/15 [redacted]w[redacted]d  7 lb 13.9 oz (3.57 kg) M CS-LTranv Spinal  LIV  2 Term 03/26/08 [redacted]w[redacted]d   F CS-LTranv   LIV  1 Term 04/27/06 [redacted]w[redacted]d   M Vag-Vacuum   ND     Complications: Anencephaly    Any issues with any prior pregnancies: yes (anencephaly, still birth G1) Any prior children are healthy, doing well, without any problems or issues: yes History of pap smears: No. Last pap smear 2016. Abnormal: no  History of STIs: No   Past Medical History: Past Medical History:  Diagnosis Date  . Anemia     Past Surgical History: Past Surgical History:  Procedure Laterality Date  . CESAREAN SECTION  2009  . CESAREAN SECTION WITH BILATERAL TUBAL LIGATION N/A 11/18/2015   Procedure: CESAREAN SECTION ;  Surgeon: Nadara Mustard, MD;  Location: [facility] ORS;  Service: Obstetrics;  Laterality: N/A;    Family History:  Family History  Problem Relation Age of Onset  . Hypertension Mother   .  Diabetes Mother    She denies any female cancers, bleeding or blood clotting disorders.  She denies any history of mental retardation, birth defects or genetic disorders in her or the FOB's history  Social History:  Social History   Socioeconomic History  . Marital status: Single    Spouse name: Not on file  . Number of children: Not on file  . Years of education: Not on file  . Highest education level: Not on file  Occupational History  . Not on file  Social Needs  . Financial resource strain: Not on file  . Food insecurity:    Worry: Not on file    Inability: Not on file  . Transportation needs:    Medical: Not on file    Non-medical: Not on file  Tobacco Use  . Smoking status: Never Smoker  . Smokeless tobacco: Never Used  Substance and Sexual Activity  . Alcohol use: No  . Drug use: No  . Sexual activity: Yes    Birth control/protection: None  Lifestyle  . Physical activity:    Days per week: Not on file    Minutes per session: Not on file  . Stress: Not on file  Relationships  . Social connections:    Talks on phone: Not  on file    Gets together: Not on file    Attends religious service: Not on file    Active member of club or organization: Not on file    Attends meetings of clubs or organizations: Not on file    Relationship status: Not on file  . Intimate partner violence:    Fear of current or ex partner: Not on file    Emotionally abused: Not on file    Physically abused: Not on file    Forced sexual activity: Not on file  Other Topics Concern  . Not on file  Social History Narrative  . Not on file   Any pets in the household: no  Allergy: No Known Allergies  Current Outpatient Medications:  Current Outpatient Medications:  .  Doxylamine-Pyridoxine (DICLEGIS) 10-10 MG TBEC, Take 2 tablets by mouth at bedtime. If symptoms persist, add one tablet in the morning and one in the afternoon, Disp: 100 tablet, Rfl: 5 .  ferrous gluconate (FERGON) 324  MG tablet, Take 1 tablet (324 mg total) by mouth 2 (two) times daily with a meal. (Patient not taking: Reported on 01/10/2018), Disp: 90 tablet, Rfl: 0 .  ibuprofen (ADVIL,MOTRIN) 600 MG tablet, Take 1 tablet (600 mg total) by mouth every 6 (six) hours as needed for moderate pain. (Patient not taking: Reported on 01/10/2018), Disp: 30 tablet, Rfl: 1 .  LO LOESTRIN FE 1 MG-10 MCG / 10 MCG tablet, TAKE 1 TABLET BY MOUTH EVERY DAY FOR 28 DAYS (Patient not taking: Reported on 01/10/2018), Disp: 28 tablet, Rfl: 11 .  oxyCODONE-acetaminophen (PERCOCET/ROXICET) 5-325 MG tablet, Take 1 tablet by mouth every 6 (six) hours as needed for severe pain. (Patient not taking: Reported on 01/10/2018), Disp: 35 tablet, Rfl: 0 .  polyethylene glycol (MIRALAX / GLYCOLAX) packet, Take 17 g by mouth daily as needed for mild constipation or moderate constipation. (Patient not taking: Reported on 01/10/2018), Disp: 30 each, Rfl: 1 .  Prenat-FeFum-FePo-FA-DHA w/o A (PROVIDA DHA) 16-16-1.25-110 MG CAPS, Take 1 capsule by mouth daily., Disp: 30 capsule, Rfl: 11 .  Prenatal Vit-Fe Fumarate-FA (MTERYTI PO), Take 1 tablet by mouth 2 (two) times daily., Disp: , Rfl:    Physical Exam:   BP 100/60   Wt 196 lb (88.9 kg)   LMP 10/22/2017 (LMP Unknown)   BMI 38.28 kg/m  Body mass index is 38.28 kg/m. Constitutional: Well nourished, well developed female in no acute distress.  Neck:  Supple, normal appearance, and no thyromegaly  Cardiovascular: S1, S2 normal, no murmur, rub or gallop, regular rate and rhythm Respiratory:  Clear to auscultation bilateral. Normal respiratory effort Abdomen: positive bowel sounds and no masses, hernias; diffusely non tender to palpation, non distended Breasts: breasts appear normal, no suspicious masses, no skin or nipple changes or axillary nodes. Neuro/Psych:  Normal mood and affect.  Skin:  Warm and dry.  Lymphatic:  No inguinal lymphadenopathy.   Pelvic exam: is not limited by body  habitus EGBUS: within normal limits, Vagina: within normal limits and with no blood in the vault, Cervix: normal appearing cervix without discharge or lesions, closed/long/high, Uterus:  enlarged: 8 weeks, and Adnexa:  not evaluated  Assessment: Ms. Muska is a 27 y.o. [id number] [redacted]w[redacted]d based on Patient's last menstrual period was 10/22/2017 (lmp unknown). with an Estimated Date of Delivery: 07/29/18,  for prenatal care.  Plan:  1) Avoid alcoholic beverages. 2) Patient encouraged not to smoke.  3) Discontinue the use of all non-medicinal drugs and chemicals.  4)  Take prenatal vitamins daily.  5) Seatbelt use advised 6) Nutrition, food safety (fish, cheese advisories, and high nitrite foods) and exercise discussed. 7) Hospital and [facility] discussed  8) Patient is asked about travel to areas at risk for the Zika virus, and counseled to avoid travel and exposure to mosquitoes or sexual partners who may have themselves been exposed to the virus. Testing is discussed, and will be ordered as appropriate.  9) [facility] advised 10) Genetic Screening, such as with 1st Trimester Screening, cell free fetal DNA, AFP testing, and Ultrasound, as well as with amniocentesis and CVS as appropriate, is discussed with patient. She plans to have not genetic testing this pregnancy. 11) US dating due to IRREG MENSES today 12) Plan AFP4 due to anencephaly.  Folic acid plus PNV advised. 13) Plans CS.  Desires BTL also.  Problem list reviewed and updated.  Annamarie MajorPaul Durant Scibilia, MD, Merlinda FrederickFACOG [facility], [facility] 01/10/2018  11:13 AM

## 2018-01-10 NOTE — Patient Instructions (Signed)
First Trimester of Pregnancy The first trimester of pregnancy is from week 1 until the end of week 13 (months 1 through 3). A week after a sperm fertilizes an egg, the egg will implant on the wall of the uterus. This embryo will begin to develop into a baby. Genes from you and your partner will form the baby. The female genes will determine whether the baby will be a boy or a girl. At 6-8 weeks, the eyes and face will be formed, and the heartbeat can be seen on ultrasound. At the end of 12 weeks, all the baby's organs will be formed. Now that you are pregnant, you will want to do everything you can to have a healthy baby. Two of the most important things are to get good prenatal care and to follow your health care provider's instructions. Prenatal care is all the medical care you receive before the baby's birth. This care will help prevent, find, and treat any problems during the pregnancy and childbirth. Body changes during your first trimester Your body goes through many changes during pregnancy. The changes vary from woman to woman.  You may gain or lose a couple of pounds at first.  You may feel sick to your stomach (nauseous) and you may throw up (vomit). If the vomiting is uncontrollable, call your health care provider.  You may tire easily.  You may develop headaches that can be relieved by medicines. All medicines should be approved by your health care provider.  You may urinate more often. Painful urination may mean you have a bladder infection.  You may develop heartburn as a result of your pregnancy.  You may develop constipation because certain hormones are causing the muscles that push stool through your intestines to slow down.  You may develop hemorrhoids or swollen veins (varicose veins).  Your breasts may begin to grow larger and become tender. Your nipples may stick out more, and the tissue that surrounds them (areola) may become darker.  Your gums may bleed and may be  sensitive to brushing and flossing.  Dark spots or blotches (chloasma, mask of pregnancy) may develop on your face. This will likely fade after the baby is born.  Your menstrual periods will stop.  You may have a loss of appetite.  You may develop cravings for certain kinds of food.  You may have changes in your emotions from day to day, such as being excited to be pregnant or being concerned that something may go wrong with the pregnancy and baby.  You may have more vivid and strange dreams.  You may have changes in your hair. These can include thickening of your hair, rapid growth, and changes in texture. Some women also have hair loss during or after pregnancy, or hair that feels dry or thin. Your hair will most likely return to normal after your baby is born.  What to expect at prenatal visits During a routine prenatal visit:  You will be weighed to make sure you and the baby are growing normally.  Your blood pressure will be taken.  Your abdomen will be measured to track your baby's growth.  The fetal heartbeat will be listened to between weeks 10 and 14 of your pregnancy.  Test results from any previous visits will be discussed.  Your health care provider may ask you:  How you are feeling.  If you are feeling the baby move.  If you have had any abnormal symptoms, such as leaking fluid, bleeding, severe headaches,   or abdominal cramping.  If you are using any tobacco products, including cigarettes, chewing tobacco, and electronic cigarettes.  If you have any questions.  Other tests that may be performed during your first trimester include:  Blood tests to find your blood type and to check for the presence of any previous infections. The tests will also be used to check for low iron levels (anemia) and protein on red blood cells (Rh antibodies). Depending on your risk factors, or if you previously had diabetes during pregnancy, you may have tests to check for high blood  sugar that affects pregnant women (gestational diabetes).  Urine tests to check for infections, diabetes, or protein in the urine.  An ultrasound to confirm the proper growth and development of the baby.  Fetal screens for spinal cord problems (spina bifida) and Down syndrome.  HIV (human immunodeficiency virus) testing. Routine prenatal testing includes screening for HIV, unless you choose not to have this test.  You may need other tests to make sure you and the baby are doing well.  Follow these instructions at home: Medicines  Follow your health care provider's instructions regarding medicine use. Specific medicines may be either safe or unsafe to take during pregnancy.  Take a prenatal vitamin that contains at least 600 micrograms (mcg) of folic acid.  If you develop constipation, try taking a stool softener if your health care provider approves. Eating and drinking  Eat a balanced diet that includes fresh fruits and vegetables, whole grains, good sources of protein such as meat, eggs, or tofu, and low-fat dairy. Your health care provider will help you determine the amount of weight gain that is right for you.  Avoid raw meat and uncooked cheese. These carry germs that can cause birth defects in the baby.  Eating four or five small meals rather than three large meals a day may help relieve nausea and vomiting. If you start to feel nauseous, eating a few soda crackers can be helpful. Drinking liquids between meals, instead of during meals, also seems to help ease nausea and vomiting.  Limit foods that are high in fat and processed sugars, such as fried and sweet foods.  To prevent constipation: ? Eat foods that are high in fiber, such as fresh fruits and vegetables, whole grains, and beans. ? Drink enough fluid to keep your urine clear or pale yellow. Activity  Exercise only as directed by your health care provider. Most women can continue their usual exercise routine during  pregnancy. Try to exercise for 30 minutes at least 5 days a week. Exercising will help you: ? Control your weight. ? Stay in shape. ? Be prepared for labor and delivery.  Experiencing pain or cramping in the lower abdomen or lower back is a good sign that you should stop exercising. Check with your health care provider before continuing with normal exercises.  Try to avoid standing for long periods of time. Move your legs often if you must stand in one place for a long time.  Avoid heavy lifting.  Wear low-heeled shoes and practice good posture.  You may continue to have sex unless your health care provider tells you not to. Relieving pain and discomfort  Wear a good support bra to relieve breast tenderness.  Take warm sitz baths to soothe any pain or discomfort caused by hemorrhoids. Use hemorrhoid cream if your health care provider approves.  Rest with your legs elevated if you have leg cramps or low back pain.  If you develop   varicose veins in your legs, wear support hose. Elevate your feet for 15 minutes, 3-4 times a day. Limit salt in your diet. Prenatal care  Schedule your prenatal visits by the twelfth week of pregnancy. They are usually scheduled monthly at first, then more often in the last 2 months before delivery.  Write down your questions. Take them to your prenatal visits.  Keep all your prenatal visits as told by your health care provider. This is important. Safety  Wear your seat belt at all times when driving.  Make a list of emergency phone numbers, including numbers for family, friends, the hospital, and police and fire departments. General instructions  Ask your health care provider for a referral to a local prenatal education class. Begin classes no later than the beginning of month 6 of your pregnancy.  Ask for help if you have counseling or nutritional needs during pregnancy. Your health care provider can offer advice or refer you to specialists for help  with various needs.  Do not use hot tubs, steam rooms, or saunas.  Do not douche or use tampons or scented sanitary pads.  Do not cross your legs for long periods of time.  Avoid cat litter boxes and soil used by cats. These carry germs that can cause birth defects in the baby and possibly loss of the fetus by miscarriage or stillbirth.  Avoid all smoking, herbs, alcohol, and medicines not prescribed by your health care provider. Chemicals in these products affect the formation and growth of the baby.  Do not use any products that contain nicotine or tobacco, such as cigarettes and e-cigarettes. If you need help quitting, ask your health care provider. You may receive counseling support and other resources to help you quit.  Schedule a dentist appointment. At home, brush your teeth with a soft toothbrush and be gentle when you floss. Contact a health care provider if:  You have dizziness.  You have mild pelvic cramps, pelvic pressure, or nagging pain in the abdominal area.  You have persistent nausea, vomiting, or diarrhea.  You have a bad smelling vaginal discharge.  You have pain when you urinate.  You notice increased swelling in your face, hands, legs, or ankles.  You are exposed to fifth disease or chickenpox.  You are exposed to German measles (rubella) and have never had it. Get help right away if:  You have a fever.  You are leaking fluid from your vagina.  You have spotting or bleeding from your vagina.  You have severe abdominal cramping or pain.  You have rapid weight gain or loss.  You vomit blood or material that looks like coffee grounds.  You develop a severe headache.  You have shortness of breath.  You have any kind of trauma, such as from a fall or a car accident. Summary  The first trimester of pregnancy is from week 1 until the end of week 13 (months 1 through 3).  Your body goes through many changes during pregnancy. The changes vary from  woman to woman.  You will have routine prenatal visits. During those visits, your health care provider will examine you, discuss any test results you may have, and talk with you about how you are feeling. This information is not intended to replace advice given to you by your health care provider. Make sure you discuss any questions you have with your health care provider. Document Released: 07/11/2001 Document Revised: 06/28/2016 Document Reviewed: 06/28/2016 Elsevier Interactive Patient Education  2018 [employer]  

## 2018-01-12 LAB — URINE CULTURE: Organism ID, Bacteria: NO GROWTH

## 2018-01-14 ENCOUNTER — Encounter: Payer: Self-pay | Admitting: Obstetrics & Gynecology

## 2018-01-14 LAB — IGP,CTNGTV,RFX APTIMA HPV ASCU
Chlamydia, Nuc. Acid Amp: NEGATIVE
GONOCOCCUS, NUC. ACID AMP: NEGATIVE
PAP Smear Comment: 0
Trich vag by NAA: NEGATIVE

## 2018-01-15 NOTE — Telephone Encounter (Signed)
Order is in and Rx for Diclegis (doxylamine-pyridoxine) so not sure the concern.  Prior Auth? Please call pharmacy and see.

## 2018-01-18 LAB — RPR+RH+ABO+RUB AB+AB SCR+CB...
ANTIBODY SCREEN: NEGATIVE
HEMOGLOBIN: 12 g/dL (ref 11.1–15.9)
HEP B S AG: NEGATIVE
HIV Screen 4th Generation wRfx: NONREACTIVE
Hematocrit: 35.8 % (ref 34.0–46.6)
MCH: 29.5 pg (ref 26.6–33.0)
MCHC: 33.5 g/dL (ref 31.5–35.7)
MCV: 88 fL (ref 79–97)
Platelets: 272 10*3/uL (ref 150–450)
RBC: 4.07 x10E6/uL (ref 3.77–5.28)
RDW: 12.7 % (ref 12.3–15.4)
RPR Ser Ql: NONREACTIVE
Rh Factor: POSITIVE
Rubella Antibodies, IGG: 5.3 index (ref >0.99)
Varicella zoster IgG: 1323 index (ref >165)
WBC: 9.6 10*3/uL (ref 3.4–10.8)

## 2018-01-18 LAB — HEMOGLOBINOPATHY EVALUATION
HEMOGLOBIN A2 QUANTITATION: 2.1 % (ref 1.8–3.2)
HEMOGLOBIN F QUANTITATION: 0 % (ref 0.0–2.0)
HGB C: 0 %
HGB S: 0 %
HGB VARIANT: 0 %
Hgb A: 97.9 % (ref 96.4–98.8)

## 2018-01-24 ENCOUNTER — Encounter: Payer: Self-pay | Admitting: Advanced Practice Midwife

## 2018-01-24 ENCOUNTER — Ambulatory Visit (INDEPENDENT_AMBULATORY_CARE_PROVIDER_SITE_OTHER): Payer: Self-pay | Admitting: Advanced Practice Midwife

## 2018-01-24 VITALS — BP 112/74 | Wt 195.0 lb

## 2018-01-24 DIAGNOSIS — O34219 Maternal care for unspecified type scar from previous cesarean delivery: Secondary | ICD-10-CM

## 2018-01-24 DIAGNOSIS — Z3A11 11 weeks gestation of pregnancy: Secondary | ICD-10-CM

## 2018-01-24 NOTE — Patient Instructions (Signed)

## 2018-01-24 NOTE — Progress Notes (Signed)
  Routine Prenatal Care Visit  Subjective  Deanna MaterKeona M Vaughn is a 27 y.o. [id number] at 5976w1d being seen today for ongoing prenatal care.  She is currently monitored for the following issues for this high-risk pregnancy and has Family history of anencephaly; Abdominal pain affecting pregnancy, antepartum; History of cesarean delivery; S/P cesarean section; and Supervision of high risk pregnancy, antepartum, first trimester on their problem list.  ----------------------------------------------------------------------------------- Patient reports some nausea. She is still waiting for Rx of Diclegis. Sample given of Bonjesta. Reviewed results of dating scan.    . Vag. Bleeding: None.   . Denies leaking of fluid.  ----------------------------------------------------------------------------------- The following portions of the patient's history were reviewed and updated as appropriate: allergies, current medications, past family history, past medical history, past social history, past surgical history and problem list. Problem list updated.   Objective  Blood pressure 112/74, weight 195 lb (88.5 kg), last menstrual period 10/22/2017, unknown if currently breastfeeding. Pregravid weight 175 lb (79.4 kg) Total Weight Gain 20 lb (9.072 kg) Urinalysis:      Fetal Status: Fetal Heart Rate (bpm): 170         General:  Alert, oriented and cooperative. Patient is in no acute distress.  Skin: Skin is warm and dry. No rash noted.   Cardiovascular: Normal heart rate noted  Respiratory: Normal respiratory effort, no problems with respiration noted  Abdomen: Soft, gravid, appropriate for gestational age.       Pelvic:  Cervical exam deferred        Extremities: Normal range of motion.     Mental Status: Normal mood and affect. Normal behavior. Normal judgment and thought content.   Assessment   27 y.o. [id number] at 4076w1d by  08/14/2018, by Ultrasound presenting for routine prenatal visit  Plan   [email or web address]  Problems (from 10/22/17 to present)    No problems associated with this episode.       Preterm labor symptoms and general obstetric precautions including but not limited to vaginal bleeding, contractions, leaking of fluid and fetal movement were reviewed in detail with the patient. Please refer to After Visit Summary for other counseling recommendations.   Return in about 1 month (around 02/21/2018) for rob.  Tresea MallJane Yoali Deanna Vaughn 01/24/2018 10:43 AM

## 2018-02-21 ENCOUNTER — Ambulatory Visit (INDEPENDENT_AMBULATORY_CARE_PROVIDER_SITE_OTHER): Payer: BLUE CROSS/BLUE SHIELD | Admitting: Obstetrics & Gynecology

## 2018-02-21 VITALS — BP 100/70 | Wt 194.0 lb

## 2018-02-21 DIAGNOSIS — Z87728 Personal history of other specified (corrected) congenital malformations of nervous system and sense organs: Secondary | ICD-10-CM

## 2018-02-21 DIAGNOSIS — O0991 Supervision of high risk pregnancy, unspecified, first trimester: Secondary | ICD-10-CM

## 2018-02-21 DIAGNOSIS — Z98891 History of uterine scar from previous surgery: Secondary | ICD-10-CM

## 2018-02-21 DIAGNOSIS — Z8279 Family history of other congenital malformations, deformations and chromosomal abnormalities: Secondary | ICD-10-CM | POA: Diagnosis not present

## 2018-02-21 DIAGNOSIS — Z3A15 15 weeks gestation of pregnancy: Secondary | ICD-10-CM

## 2018-02-21 NOTE — Progress Notes (Signed)
  Subjective  Fetal Movement? yes Contractions? no Leaking Fluid? no Vaginal Bleeding? no Some lower abd pressure at times Objective  BP 100/70   Wt 194 lb (88 kg)   LMP 10/22/2017 (LMP Unknown)   BMI 37.89 kg/m  General: NAD Pumonary: no increased work of breathing Abdomen: gravid, non-tender Extremities: no edema Psychiatric: mood appropriate, affect full FHT by US today, 150s, breech, ant placenta Assessment  27 y.o. [id number] at 5116w1d by  08/14/2018, by Ultrasound presenting for routine prenatal visit  Plan   Problem List Items Addressed This Visit      Other   History of cesarean delivery   Supervision of high risk pregnancy, antepartum, first trimester    Other Visit Diagnoses    History of anencephaly    -  Primary   Relevant Orders   AFP, Quad Screen   [redacted] weeks gestation of pregnancy       Relevant Orders   US OB Comp + 14 Wk      Annamarie MajorPaul Javani Spratt, MD, Merlinda FrederickFACOG [facility], [facility] 02/21/2018  11:22 AM

## 2018-02-21 NOTE — Patient Instructions (Signed)

## 2018-02-21 NOTE — Addendum Note (Signed)
Addended by: Nadara MustardHARRIS, Stefan Karen P on: 02/21/2018 11:41 AM   Modules accepted: Orders

## 2018-02-23 LAB — AFP TETRA
DIA Mom Value: 0.57
DIA Value (EIA): 97.02 pg/mL
DSR (BY AGE) 1 IN: 765
DSR (SECOND TRIMESTER) 1 IN: 8206
Gestational Age: 15.1 WEEKS
MSAFP MOM: 1.04
MSAFP: 29.8 ng/mL
MSHCG MOM: 0.93
MSHCG: 48773 m[IU]/mL
Maternal Age At EDD: 29.2 yr
Osb Risk: 10000
T18 (By Age): 1:2981 {titer}
TEST RESULTS AFP: NEGATIVE
WEIGHT: 175 [lb_av]
uE3 Mom: 0.82
uE3 Value: 0.54 ng/mL

## 2018-03-21 ENCOUNTER — Ambulatory Visit (INDEPENDENT_AMBULATORY_CARE_PROVIDER_SITE_OTHER): Payer: BLUE CROSS/BLUE SHIELD | Admitting: Obstetrics & Gynecology

## 2018-03-21 ENCOUNTER — Ambulatory Visit (INDEPENDENT_AMBULATORY_CARE_PROVIDER_SITE_OTHER): Payer: BLUE CROSS/BLUE SHIELD

## 2018-03-21 VITALS — BP 100/70 | Wt 196.0 lb

## 2018-03-21 DIAGNOSIS — O0991 Supervision of high risk pregnancy, unspecified, first trimester: Secondary | ICD-10-CM

## 2018-03-21 DIAGNOSIS — Z8279 Family history of other congenital malformations, deformations and chromosomal abnormalities: Secondary | ICD-10-CM

## 2018-03-21 DIAGNOSIS — Z3A15 15 weeks gestation of pregnancy: Secondary | ICD-10-CM

## 2018-03-21 DIAGNOSIS — Z363 Encounter for antenatal screening for malformations: Secondary | ICD-10-CM | POA: Diagnosis not present

## 2018-03-21 DIAGNOSIS — Z98891 History of uterine scar from previous surgery: Secondary | ICD-10-CM

## 2018-03-21 DIAGNOSIS — O34219 Maternal care for unspecified type scar from previous cesarean delivery: Secondary | ICD-10-CM

## 2018-03-21 DIAGNOSIS — Z3A19 19 weeks gestation of pregnancy: Secondary | ICD-10-CM

## 2018-03-21 LAB — POCT URINALYSIS DIPSTICK OB
GLUCOSE, UA: NEGATIVE — AB
POC,PROTEIN,UA: NEGATIVE

## 2018-03-21 NOTE — Patient Instructions (Signed)

## 2018-03-21 NOTE — Progress Notes (Signed)
  Subjective  Fetal Movement? yes Contractions? no Leaking Fluid? no Vaginal Bleeding? no  Objective  BP 100/70   Wt 196 lb (88.9 kg)   LMP 10/22/2017 (LMP Unknown)   BMI 38.28 kg/m  General: NAD Pumonary: no increased work of breathing Abdomen: gravid, non-tender Extremities: no edema Psychiatric: mood appropriate, affect full  Assessment  27 y.o. [id number] at 5620w1d by  08/14/2018, by Ultrasound presenting for routine prenatal visit  Plan   Problem List Items Addressed This Visit      Other   Family history of anencephaly   S/P cesarean section   Supervision of high risk pregnancy, antepartum, first trimester    Other Visit Diagnoses    [redacted] weeks gestation of pregnancy    -  Primary    Review of ULTRASOUND. I have personally reviewed images and [facility] There is a singleton gestation with subjectively normal amniotic fluid volume. The fetal biometry correlates with established dating. Detailed evaluation of the fetal anatomy was performed.The fetal anatomical survey appears within normal limits within the resolution of ultrasound as described above.  It must be noted that a normal ultrasound is unable to rule out fetal aneuploidy.    AFP normal, discussed Plans CS BTL  Annamarie MajorPaul Demarrion Meiklejohn, MD, Merlinda FrederickFACOG [facility], [facility] 03/21/2018  3:20 PM

## 2018-04-04 ENCOUNTER — Other Ambulatory Visit: Payer: Self-pay | Admitting: Obstetrics & Gynecology

## 2018-04-18 ENCOUNTER — Ambulatory Visit (INDEPENDENT_AMBULATORY_CARE_PROVIDER_SITE_OTHER): Payer: BLUE CROSS/BLUE SHIELD | Admitting: Obstetrics & Gynecology

## 2018-04-18 VITALS — BP 112/64 | Wt 199.0 lb

## 2018-04-18 DIAGNOSIS — Z8279 Family history of other congenital malformations, deformations and chromosomal abnormalities: Secondary | ICD-10-CM

## 2018-04-18 DIAGNOSIS — O0991 Supervision of high risk pregnancy, unspecified, first trimester: Secondary | ICD-10-CM

## 2018-04-18 DIAGNOSIS — Z98891 History of uterine scar from previous surgery: Secondary | ICD-10-CM

## 2018-04-18 DIAGNOSIS — O34219 Maternal care for unspecified type scar from previous cesarean delivery: Secondary | ICD-10-CM

## 2018-04-18 DIAGNOSIS — Z3A23 23 weeks gestation of pregnancy: Secondary | ICD-10-CM

## 2018-04-18 LAB — POCT URINALYSIS DIPSTICK OB
Glucose, UA: NEGATIVE
PROTEIN: NEGATIVE

## 2018-04-18 NOTE — Progress Notes (Signed)
  Subjective  Fetal Movement? yes Contractions? no Leaking Fluid? no Vaginal Bleeding? no  Objective  BP 112/64   Wt 199 lb (90.3 kg)   LMP 10/22/2017 (LMP Unknown)   BMI 38.86 kg/m  General: NAD Pumonary: no increased work of breathing Abdomen: gravid, non-tender Extremities: no edema Psychiatric: mood appropriate, affect full  Assessment  27 y.o. [id number] at 4237w1d by  08/14/2018, by Ultrasound presenting for routine prenatal visit  Plan   Problem List Items Addressed This Visit      Other   Family history of anencephaly   History of cesarean delivery   Supervision of high risk pregnancy, antepartum, first trimester   Relevant Orders   28 Week RH+Panel    Other Visit Diagnoses    [redacted] weeks gestation of pregnancy    -  Primary   Relevant Orders   POC Urinalysis Dipstick OB (Completed)    Flu shot declined  Annamarie MajorPaul Sarabi Sockwell, MD, Merlinda FrederickFACOG [facility], [facility] 04/18/2018  1:46 PM

## 2018-04-18 NOTE — Patient Instructions (Signed)

## 2018-04-18 NOTE — Progress Notes (Signed)
ROB

## 2018-05-17 ENCOUNTER — Ambulatory Visit (INDEPENDENT_AMBULATORY_CARE_PROVIDER_SITE_OTHER): Payer: BLUE CROSS/BLUE SHIELD | Admitting: Obstetrics & Gynecology

## 2018-05-17 ENCOUNTER — Other Ambulatory Visit: Payer: BLUE CROSS/BLUE SHIELD

## 2018-05-17 VITALS — BP 100/70 | Wt 200.0 lb

## 2018-05-17 DIAGNOSIS — O0991 Supervision of high risk pregnancy, unspecified, first trimester: Secondary | ICD-10-CM

## 2018-05-17 DIAGNOSIS — O34219 Maternal care for unspecified type scar from previous cesarean delivery: Secondary | ICD-10-CM

## 2018-05-17 DIAGNOSIS — Z3A27 27 weeks gestation of pregnancy: Secondary | ICD-10-CM

## 2018-05-17 DIAGNOSIS — Z8279 Family history of other congenital malformations, deformations and chromosomal abnormalities: Secondary | ICD-10-CM

## 2018-05-17 DIAGNOSIS — Z98891 History of uterine scar from previous surgery: Secondary | ICD-10-CM

## 2018-05-17 NOTE — Patient Instructions (Signed)
Third Trimester of Pregnancy The third trimester is from week 28 through week 40 (months 7 through 9). The third trimester is a time when the unborn baby (fetus) is growing rapidly. At the end of the ninth month, the fetus is about 20 inches in length and weighs 6-10 pounds. Body changes during your third trimester Your body will continue to go through many changes during pregnancy. The changes vary from woman to woman. During the third trimester:  Your weight will continue to increase. You can expect to gain 25-35 pounds (11-16 kg) by the end of the pregnancy.  You may begin to get stretch marks on your hips, abdomen, and breasts.  You may urinate more often because the fetus is moving lower into your pelvis and pressing on your bladder.  You may develop or continue to have heartburn. This is caused by increased hormones that slow down muscles in the digestive tract.  You may develop or continue to have constipation because increased hormones slow digestion and cause the muscles that push waste through your intestines to relax.  You may develop hemorrhoids. These are swollen veins (varicose veins) in the rectum that can itch or be painful.  You may develop swollen, bulging veins (varicose veins) in your legs.  You may have increased body aches in the pelvis, back, or thighs. This is due to weight gain and increased hormones that are relaxing your joints.  You may have changes in your hair. These can include thickening of your hair, rapid growth, and changes in texture. Some women also have hair loss during or after pregnancy, or hair that feels dry or thin. Your hair will most likely return to normal after your baby is born.  Your breasts will continue to grow and they will continue to become tender. A yellow fluid (colostrum) may leak from your breasts. This is the first milk you are producing for your baby.  Your belly button may stick out.  You may notice more swelling in your hands,  face, or ankles.  You may have increased tingling or numbness in your hands, arms, and legs. The skin on your belly may also feel numb.  You may feel short of breath because of your expanding uterus.  You may have more problems sleeping. This can be caused by the size of your belly, increased need to urinate, and an increase in your body's metabolism.  You may notice the fetus "dropping," or moving lower in your abdomen (lightening).  You may have increased vaginal discharge.  You may notice your joints feel loose and you may have pain around your pelvic bone.  What to expect at prenatal visits You will have prenatal exams every 2 weeks until week 36. Then you will have weekly prenatal exams. During a routine prenatal visit:  You will be weighed to make sure you and the baby are growing normally.  Your blood pressure will be taken.  Your abdomen will be measured to track your baby's growth.  The fetal heartbeat will be listened to.  Any test results from the previous visit will be discussed.  You may have a cervical check near your due date to see if your cervix has softened or thinned (effaced).  You will be tested for Group B streptococcus. This happens between 35 and 37 weeks.  Your health care provider may ask you:  What your birth plan is.  How you are feeling.  If you are feeling the baby move.  If you have had   any abnormal symptoms, such as leaking fluid, bleeding, severe headaches, or abdominal cramping.  If you are using any tobacco products, including cigarettes, chewing tobacco, and electronic cigarettes.  If you have any questions.  Other tests or screenings that may be performed during your third trimester include:  Blood tests that check for low iron levels (anemia).  Fetal testing to check the health, activity level, and growth of the fetus. Testing is done if you have certain medical conditions or if there are problems during the  pregnancy.  Nonstress test (NST). This test checks the health of your baby to make sure there are no signs of problems, such as the baby not getting enough oxygen. During this test, a belt is placed around your belly. The baby is made to move, and its heart rate is monitored during movement.  What is false labor? False labor is a condition in which you feel small, irregular tightenings of the muscles in the womb (contractions) that usually go away with rest, changing position, or drinking water. These are called Braxton Hicks contractions. Contractions may last for hours, days, or even weeks before true labor sets in. If contractions come at regular intervals, become more frequent, increase in intensity, or become painful, you should see your health care provider. What are the signs of labor?  Abdominal cramps.  Regular contractions that start at 10 minutes apart and become stronger and more frequent with time.  Contractions that start on the top of the uterus and spread down to the lower abdomen and back.  Increased pelvic pressure and dull back pain.  A watery or bloody mucus discharge that comes from the vagina.  Leaking of amniotic fluid. This is also known as your "water breaking." It could be a slow trickle or a gush. Let your health care provider know if it has a color or strange odor. If you have any of these signs, call your health care provider right away, even if it is before your due date. Follow these instructions at home: Medicines  Follow your health care provider's instructions regarding medicine use. Specific medicines may be either safe or unsafe to take during pregnancy.  Take a prenatal vitamin that contains at least 600 micrograms (mcg) of folic acid.  If you develop constipation, try taking a stool softener if your health care provider approves. Eating and drinking  Eat a balanced diet that includes fresh fruits and vegetables, whole grains, good sources of protein  such as meat, eggs, or tofu, and low-fat dairy. Your health care provider will help you determine the amount of weight gain that is right for you.  Avoid raw meat and uncooked cheese. These carry germs that can cause birth defects in the baby.  If you have low calcium intake from food, talk to your health care provider about whether you should take a daily calcium supplement.  Eat four or five small meals rather than three large meals a day.  Limit foods that are high in fat and processed sugars, such as fried and sweet foods.  To prevent constipation: ? Drink enough fluid to keep your urine clear or pale yellow. ? Eat foods that are high in fiber, such as fresh fruits and vegetables, whole grains, and beans. Activity  Exercise only as directed by your health care provider. Most women can continue their usual exercise routine during pregnancy. Try to exercise for 30 minutes at least 5 days a week. Stop exercising if you experience uterine contractions.  Avoid heavy   lifting.  Do not exercise in extreme heat or humidity, or at high altitudes.  Wear low-heel, comfortable shoes.  Practice good posture.  You may continue to have sex unless your health care provider tells you otherwise. Relieving pain and discomfort  Take frequent breaks and rest with your legs elevated if you have leg cramps or low back pain.  Take warm sitz baths to soothe any pain or discomfort caused by hemorrhoids. Use hemorrhoid cream if your health care provider approves.  Wear a good support bra to prevent discomfort from breast tenderness.  If you develop varicose veins: ? Wear support pantyhose or compression stockings as told by your healthcare provider. ? Elevate your feet for 15 minutes, 3-4 times a day. Prenatal care  Write down your questions. Take them to your prenatal visits.  Keep all your prenatal visits as told by your health care provider. This is important. Safety  Wear your seat belt at  all times when driving.  Make a list of emergency phone numbers, including numbers for family, friends, the hospital, and police and fire departments. General instructions  Avoid cat litter boxes and soil used by cats. These carry germs that can cause birth defects in the baby. If you have a cat, ask someone to clean the litter box for you.  Do not travel far distances unless it is absolutely necessary and only with the approval of your health care provider.  Do not use hot tubs, steam rooms, or saunas.  Do not drink alcohol.  Do not use any products that contain nicotine or tobacco, such as cigarettes and e-cigarettes. If you need help quitting, ask your health care provider.  Do not use any medicinal herbs or unprescribed drugs. These chemicals affect the formation and growth of the baby.  Do not douche or use tampons or scented sanitary pads.  Do not cross your legs for long periods of time.  To prepare for the arrival of your baby: ? Take prenatal classes to understand, practice, and ask questions about labor and delivery. ? Make a trial run to the hospital. ? Visit the hospital and tour the maternity area. ? Arrange for maternity or paternity leave through employers. ? Arrange for family and friends to take care of pets while you are in the hospital. ? Purchase a rear-facing car seat and make sure you know how to install it in your car. ? Pack your hospital bag. ? Prepare the baby's nursery. Make sure to remove all pillows and stuffed animals from the baby's crib to prevent suffocation.  Visit your dentist if you have not gone during your pregnancy. Use a soft toothbrush to brush your teeth and be gentle when you floss. Contact a health care provider if:  You are unsure if you are in labor or if your water has broken.  You become dizzy.  You have mild pelvic cramps, pelvic pressure, or nagging pain in your abdominal area.  You have lower back pain.  You have persistent  nausea, vomiting, or diarrhea.  You have an unusual or bad smelling vaginal discharge.  You have pain when you urinate. Get help right away if:  Your water breaks before 37 weeks.  You have regular contractions less than 5 minutes apart before 37 weeks.  You have a fever.  You are leaking fluid from your vagina.  You have spotting or bleeding from your vagina.  You have severe abdominal pain or cramping.  You have rapid weight loss or weight gain.    You have shortness of breath with chest pain.  You notice sudden or extreme swelling of your face, hands, ankles, feet, or legs.  Your baby makes fewer than 10 movements in 2 hours.  You have severe headaches that do not go away when you take medicine.  You have vision changes. Summary  The third trimester is from week 28 through week 40, months 7 through 9. The third trimester is a time when the unborn baby (fetus) is growing rapidly.  During the third trimester, your discomfort may increase as you and your baby continue to gain weight. You may have abdominal, leg, and back pain, sleeping problems, and an increased need to urinate.  During the third trimester your breasts will keep growing and they will continue to become tender. A yellow fluid (colostrum) may leak from your breasts. This is the first milk you are producing for your baby.  False labor is a condition in which you feel small, irregular tightenings of the muscles in the womb (contractions) that eventually go away. These are called Braxton Hicks contractions. Contractions may last for hours, days, or even weeks before true labor sets in.  Signs of labor can include: abdominal cramps; regular contractions that start at 10 minutes apart and become stronger and more frequent with time; watery or bloody mucus discharge that comes from the vagina; increased pelvic pressure and dull back pain; and leaking of amniotic fluid. This information is not intended to replace advice  given to you by your health care provider. Make sure you discuss any questions you have with your health care provider. Document Released: 07/11/2001 Document Revised: 12/23/2015 Document Reviewed: 09/17/2012 Elsevier Interactive Patient Education  2017 Elsevier Inc.  

## 2018-05-17 NOTE — Progress Notes (Signed)
  Subjective  Fetal Movement? yes Contractions? no Leaking Fluid? no Vaginal Bleeding? no  Objective  BP 100/70   Wt 200 lb (90.7 kg)   LMP 10/22/2017 (LMP Unknown)   BMI 39.06 kg/m  General: NAD Pumonary: no increased work of breathing Abdomen: gravid, non-tender Extremities: no edema Psychiatric: mood appropriate, affect full  Assessment  27 y.o. [id number] at [redacted]w[redacted]d by  08/14/2018, by Ultrasound presenting for routine prenatal visit  Plan   Problem List Items Addressed This Visit      Other   Family history of anencephaly   History of cesarean delivery   Supervision of high risk pregnancy, antepartum, first trimester    Other Visit Diagnoses    [redacted] weeks gestation of pregnancy    -  Primary    Labs today CS BTL sch for 08/08/18, desires PH as did last CS's PNV, [facility], PTL precautions  Annamarie Major, MD, [facility], [facility] 05/17/2018  3:12 PM

## 2018-05-18 LAB — 28 WEEK RH+PANEL
BASOS: 0 %
Basophils Absolute: 0 10*3/uL (ref 0.0–0.2)
EOS (ABSOLUTE): 0.2 10*3/uL (ref 0.0–0.4)
Eos: 2 %
Gestational Diabetes Screen: 92 mg/dL (ref 65–139)
HEMOGLOBIN: 12 g/dL (ref 11.1–15.9)
HIV SCREEN 4TH GENERATION: NONREACTIVE
Hematocrit: 35 % (ref 34.0–46.6)
IMMATURE GRANS (ABS): 0.1 10*3/uL (ref 0.0–0.1)
Immature Granulocytes: 1 %
LYMPHS ABS: 1.7 10*3/uL (ref 0.7–3.1)
LYMPHS: 14 %
MCH: 29.5 pg (ref 26.6–33.0)
MCHC: 34.3 g/dL (ref 31.5–35.7)
MCV: 86 fL (ref 79–97)
MONOCYTES: 4 %
MONOS ABS: 0.5 10*3/uL (ref 0.1–0.9)
NEUTROS ABS: 9.6 10*3/uL — AB (ref 1.4–7.0)
Neutrophils: 79 %
Platelets: 241 10*3/uL (ref 150–450)
RBC: 4.07 x10E6/uL (ref 3.77–5.28)
RDW: 12 % — AB (ref 12.3–15.4)
RPR Ser Ql: NONREACTIVE
WBC: 12 10*3/uL — ABNORMAL HIGH (ref 3.4–10.8)

## 2018-05-20 ENCOUNTER — Telehealth: Payer: Self-pay | Admitting: Obstetrics & Gynecology

## 2018-05-20 NOTE — Telephone Encounter (Signed)
Lmtrc

## 2018-05-20 NOTE — Telephone Encounter (Signed)
Patient is aware of H&P at [facility] on 08/07/18 @ 8:40am w/ Dr Tiburcio Pea, Pre-admit Testing afterwards, and OR on 08/08/18. First case requested. Patient confirmed BCBS and secondary Medicaid, and expects to have the same policies in 2020.

## 2018-05-20 NOTE — Telephone Encounter (Signed)
-----   Message from Nadara Mustard, MD sent at 05/17/2018  3:08 PM EDT ----- Regarding: Surgery Surgery Booking Request Patient Full Name:  Deanna Vaughn  MRN: [id number]  DOB: November 02, 1990  Surgeon: Letitia Libra, MD  Requested Surgery Date and Time: 08/08/2018, MUST BE FIRST CASE as this is not my surgery day but needs to be done by me this day Primary Diagnosis AND Code: Repeat, Sterility, Term Secondary Diagnosis and Code:  Surgical Procedure: Cesarean section with tubal ligation L&D Notification: Yes Admission Status: surgery admit Length of Surgery: 1 hr Special Case Needs: OnQ H&P: yes (date) Phone Interview???: no Interpreter: Language:  Medical Clearance: no Special Scheduling Instructions: no

## 2018-06-10 ENCOUNTER — Ambulatory Visit (INDEPENDENT_AMBULATORY_CARE_PROVIDER_SITE_OTHER): Payer: BLUE CROSS/BLUE SHIELD | Admitting: Obstetrics & Gynecology

## 2018-06-10 VITALS — BP 120/80 | Wt 202.0 lb

## 2018-06-10 DIAGNOSIS — O34219 Maternal care for unspecified type scar from previous cesarean delivery: Secondary | ICD-10-CM

## 2018-06-10 DIAGNOSIS — Z23 Encounter for immunization: Secondary | ICD-10-CM

## 2018-06-10 DIAGNOSIS — O0991 Supervision of high risk pregnancy, unspecified, first trimester: Secondary | ICD-10-CM

## 2018-06-10 DIAGNOSIS — Z98891 History of uterine scar from previous surgery: Secondary | ICD-10-CM

## 2018-06-10 DIAGNOSIS — Z3A30 30 weeks gestation of pregnancy: Secondary | ICD-10-CM

## 2018-06-10 MED ORDER — TETANUS-DIPHTH-ACELL PERTUSSIS 5-2.5-18.5 LF-MCG/0.5 IM SUSP
0.5000 mL | Freq: Once | INTRAMUSCULAR | Status: AC
  Administered 2018-06-10: 0.5 mL via INTRAMUSCULAR

## 2018-06-10 NOTE — Patient Instructions (Signed)
Back Pain in Pregnancy Back pain during pregnancy is common. Back pain may be caused by several factors that are related to changes during your pregnancy. Follow these instructions at home: Managing pain, stiffness, and swelling  If directed, apply ice for sudden (acute) back pain. ? Put ice in a plastic bag. ? Place a towel between your skin and the bag. ? Leave the ice on for 20 minutes, 2-3 times per day.  If directed, apply heat to the affected area before you exercise: ? Place a towel between your skin and the heat pack or heating pad. ? Leave the heat on for 20-30 minutes. ? Remove the heat if your skin turns bright red. This is especially important if you are unable to feel pain, heat, or cold. You may have a greater risk of getting burned. Activity  Exercise as told by your health care provider. Exercising is the best way to prevent or manage back pain.  Listen to your body when lifting. If lifting hurts, ask for help or bend your knees. This uses your leg muscles instead of your back muscles.  Squat down when picking up something from the floor. Do not bend over.  Only use bed rest as told by your health care provider. Bed rest should only be used for the most severe episodes of back pain. Standing, Sitting, and Lying Down  Do not stand in one place for long periods of time.  Use good posture when sitting. Make sure your head rests over your shoulders and is not hanging forward. Use a pillow on your lower back if necessary.  Try sleeping on your side, preferably the left side, with a pillow or two between your legs. If you are sore after a night's rest, your bed may be too soft. A firm mattress may provide more support for your back during pregnancy. General instructions  Do not wear high heels.  Eat a healthy diet. Try to gain weight within your health care provider's recommendations.  Use a maternity girdle, elastic sling, or back brace as told by your health care  provider.  Take over-the-counter and prescription medicines only as told by your health care provider.  Keep all follow-up visits as told by your health care provider. This is important. This includes any visits with any specialists, such as a physical therapist. Contact a health care provider if:  Your back pain interferes with your daily activities.  You have increasing pain in other parts of your body. Get help right away if:  You develop numbness, tingling, weakness, or problems with the use of your arms or legs.  You develop severe back pain that is not controlled with medicine.  You have a sudden change in bowel or bladder control.  You develop shortness of breath, dizziness, or you faint.  You develop nausea, vomiting, or sweating.  You have back pain that is a rhythmic, cramping pain similar to labor pains. Labor pain is usually 1-2 minutes apart, lasts for about 1 minute, and involves a bearing down feeling or pressure in your pelvis.  You have back pain and your water breaks or you have vaginal bleeding.  You have back pain or numbness that travels down your leg.  Your back pain developed after you fell.  You develop pain on one side of your back.  You see blood in your urine.  You develop skin blisters in the area of your back pain. This information is not intended to replace advice given to you   by your health care provider. Make sure you discuss any questions you have with your health care provider. Document Released: 10/25/2005 Document Revised: 12/23/2015 Document Reviewed: 03/31/2015 Elsevier Interactive Patient Education  2018 Elsevier Inc.  

## 2018-06-10 NOTE — Addendum Note (Signed)
Addended by: Cornelius Moras D on: 06/10/2018 01:58 PM   Modules accepted: Orders

## 2018-06-10 NOTE — Progress Notes (Signed)
  Subjective  Fetal Movement? yes Contractions? no Leaking Fluid? no Vaginal Bleeding? no  Objective  BP 120/80   Wt 202 lb (91.6 kg)   LMP 10/22/2017 (LMP Unknown)   BMI 39.45 kg/m  General: NAD Pumonary: no increased work of breathing Abdomen: gravid, non-tender Extremities: no edema Psychiatric: mood appropriate, affect full  Assessment  27 y.o. [id number] at [redacted]w[redacted]d by  08/14/2018, by Ultrasound presenting for routine prenatal visit  Plan   Problem List Items Addressed This Visit      Other   S/P cesarean section   Supervision of high risk pregnancy, antepartum, first trimester    Other Visit Diagnoses    [redacted] weeks gestation of pregnancy    -  Primary    Bottle feeding plans TDaP today Plans CS and BTL 08/08/2018  Annamarie Major, MD, [facility], [facility] 06/10/2018  1:42 PM

## 2018-06-24 ENCOUNTER — Ambulatory Visit (INDEPENDENT_AMBULATORY_CARE_PROVIDER_SITE_OTHER): Payer: BLUE CROSS/BLUE SHIELD | Admitting: Obstetrics & Gynecology

## 2018-06-24 VITALS — BP 100/70 | Wt 205.0 lb

## 2018-06-24 DIAGNOSIS — O34219 Maternal care for unspecified type scar from previous cesarean delivery: Secondary | ICD-10-CM

## 2018-06-24 DIAGNOSIS — O0993 Supervision of high risk pregnancy, unspecified, third trimester: Secondary | ICD-10-CM

## 2018-06-24 DIAGNOSIS — Z3A32 32 weeks gestation of pregnancy: Secondary | ICD-10-CM

## 2018-06-24 DIAGNOSIS — Z87728 Personal history of other specified (corrected) congenital malformations of nervous system and sense organs: Secondary | ICD-10-CM

## 2018-06-24 DIAGNOSIS — Z98891 History of uterine scar from previous surgery: Secondary | ICD-10-CM

## 2018-06-24 NOTE — Patient Instructions (Signed)
Braxton Hicks Contractions °Contractions of the uterus can occur throughout pregnancy, but they are not always a sign that you are in labor. You may have practice contractions called Braxton Hicks contractions. These false labor contractions are sometimes confused with true labor. °What are Braxton Hicks contractions? °Braxton Hicks contractions are tightening movements that occur in the muscles of the uterus before labor. Unlike true labor contractions, these contractions do not result in opening (dilation) and thinning of the cervix. Toward the end of pregnancy (32-34 weeks), Braxton Hicks contractions can happen more often and may become stronger. These contractions are sometimes difficult to tell apart from true labor because they can be very uncomfortable. You should not feel embarrassed if you go to the hospital with false labor. °Sometimes, the only way to tell if you are in true labor is for your health care provider to look for changes in the cervix. The health care provider will do a physical exam and may monitor your contractions. If you are not in true labor, the exam should show that your cervix is not dilating and your water has not broken. °If there are other health problems associated with your pregnancy, it is completely safe for you to be sent home with false labor. You may continue to have Braxton Hicks contractions until you go into true labor. °How to tell the difference between true labor and false labor °True labor °· Contractions last 30-70 seconds. °· Contractions become very regular. °· Discomfort is usually felt in the top of the uterus, and it spreads to the lower abdomen and low back. °· Contractions do not go away with walking. °· Contractions usually become more intense and increase in frequency. °· The cervix dilates and gets thinner. °False labor °· Contractions are usually shorter and not as strong as true labor contractions. °· Contractions are usually irregular. °· Contractions  are often felt in the front of the lower abdomen and in the groin. °· Contractions may go away when you walk around or change positions while lying down. °· Contractions get weaker and are shorter-lasting as time goes on. °· The cervix usually does not dilate or become thin. °Follow these instructions at home: °· Take over-the-counter and prescription medicines only as told by your health care provider. °· Keep up with your usual exercises and follow other instructions from your health care provider. °· Eat and drink lightly if you think you are going into labor. °· If Braxton Hicks contractions are making you uncomfortable: °? Change your position from lying down or resting to walking, or change from walking to resting. °? Sit and rest in a tub of warm water. °? Drink enough fluid to keep your urine pale yellow. Dehydration may cause these contractions. °? Do slow and deep breathing several times an hour. °· Keep all follow-up prenatal visits as told by your health care provider. This is important. °Contact a health care provider if: °· You have a fever. °· You have continuous pain in your abdomen. °Get help right away if: °· Your contractions become stronger, more regular, and closer together. °· You have fluid leaking or gushing from your vagina. °· You pass blood-tinged mucus (bloody show). °· You have bleeding from your vagina. °· You have low back pain that you never had before. °· You feel your baby’s head pushing down and causing pelvic pressure. °· Your baby is not moving inside you as much as it used to. °Summary °· Contractions that occur before labor are called Braxton   Hicks contractions, false labor, or practice contractions. °· Braxton Hicks contractions are usually shorter, weaker, farther apart, and less regular than true labor contractions. True labor contractions usually become progressively stronger and regular and they become more frequent. °· Manage discomfort from Braxton Hicks contractions by  changing position, resting in a warm bath, drinking plenty of water, or practicing deep breathing. °This information is not intended to replace advice given to you by your health care provider. Make sure you discuss any questions you have with your health care provider. °Document Released: 11/30/2016 Document Revised: 11/30/2016 Document Reviewed: 11/30/2016 °Elsevier Interactive Patient Education © 2018 Elsevier Inc. ° °

## 2018-06-24 NOTE — Progress Notes (Signed)
  Subjective  Fetal Movement? yes Contractions? no Leaking Fluid? no Vaginal Bleeding? no  Objective  BP 100/70   Wt 205 lb (93 kg)   LMP 10/22/2017 (LMP Unknown)   BMI 40.04 kg/m  General: NAD Pumonary: no increased work of breathing Abdomen: gravid, non-tender Extremities: no edema Psychiatric: mood appropriate, affect full  Assessment  27 y.o. [id number] at [id number] by  08/14/2018, by Ultrasound presenting for routine prenatal visit  Plan   Problem List Items Addressed This Visit      Other   History of cesarean delivery   Supervision of high risk pregnancy, antepartum, first trimester    Other Visit Diagnoses    [redacted] weeks gestation of pregnancy    -  Primary   History of anencephaly        PTL precautions discussed PNV, [facility] CS BTL planned 08/08/18  Annamarie MajorPaul Valory Wetherby, MD, [facility], [facility] 06/24/2018  11:36 AM

## 2018-07-08 ENCOUNTER — Ambulatory Visit (INDEPENDENT_AMBULATORY_CARE_PROVIDER_SITE_OTHER): Payer: BLUE CROSS/BLUE SHIELD | Admitting: Obstetrics and Gynecology

## 2018-07-08 ENCOUNTER — Encounter: Payer: Self-pay | Admitting: Obstetrics and Gynecology

## 2018-07-08 VITALS — BP 102/62 | Wt 205.0 lb

## 2018-07-08 DIAGNOSIS — O34211 Maternal care for low transverse scar from previous cesarean delivery: Secondary | ICD-10-CM

## 2018-07-08 DIAGNOSIS — Z98891 History of uterine scar from previous surgery: Secondary | ICD-10-CM

## 2018-07-08 DIAGNOSIS — O0991 Supervision of high risk pregnancy, unspecified, first trimester: Secondary | ICD-10-CM

## 2018-07-08 DIAGNOSIS — Z3A34 34 weeks gestation of pregnancy: Secondary | ICD-10-CM

## 2018-07-08 LAB — POCT URINALYSIS DIPSTICK OB: GLUCOSE, UA: NEGATIVE

## 2018-07-08 NOTE — Progress Notes (Signed)
    Routine Prenatal Care Visit  Subjective  Deanna Vaughn is a 27 y.o. [phone] at 4170w5d being seen today for ongoing prenatal care.  She is currently monitored for the following issues for this high-risk pregnancy and has Family history of anencephaly; Abdominal pain affecting pregnancy, antepartum; History of cesarean delivery; S/P cesarean section; and Supervision of high risk pregnancy, antepartum, first trimester on their problem list.  ----------------------------------------------------------------------------------- Patient reports no complaints.    .  .   . Denies leaking of fluid.  ----------------------------------------------------------------------------------- The following portions of the patient's history were reviewed and updated as appropriate: allergies, current medications, past family history, past medical history, past social history, past surgical history and problem list. Problem list updated.   Objective  Blood pressure 102/62, weight 205 lb (93 kg), last menstrual period 10/22/2017, unknown if currently breastfeeding. Pregravid weight 175 lb (79.4 kg) Total Weight Gain 30 lb (13.6 kg) Urinalysis:      Fetal Status:           General:  Alert, oriented and cooperative. Patient is in no acute distress.  Skin: Skin is warm and dry. No rash noted.   Cardiovascular: Normal heart rate noted  Respiratory: Normal respiratory effort, no problems with respiration noted  Abdomen: Soft, gravid, appropriate for gestational age.       Pelvic:  Cervical exam deferred        Extremities: Normal range of motion.     Mental Status: Normal mood and affect. Normal behavior. Normal judgment and thought content.     Assessment   27 y.o. [id number] at 6870w5d by  08/14/2018, by Ultrasound presenting for routine prenatal visit  Plan   pregnancy4 Problems (from 10/22/17 to present)    No problems associated with this episode.       Gestational age appropriate obstetric  precautions including but not limited to vaginal bleeding, contractions, leaking of fluid and fetal movement were reviewed in detail with the patient.    FH> GA, growth US at next visit. Return in about 2 weeks (around 07/22/2018) for ROb and US.  Natale Milchhristanna R  MD [facility], [facility] 07/08/2018, 11:38 AM

## 2018-07-08 NOTE — Progress Notes (Signed)
ROB No concerns 

## 2018-07-16 ENCOUNTER — Ambulatory Visit (INDEPENDENT_AMBULATORY_CARE_PROVIDER_SITE_OTHER): Payer: BLUE CROSS/BLUE SHIELD | Admitting: Certified Nurse Midwife

## 2018-07-16 VITALS — BP 102/64 | Wt 206.0 lb

## 2018-07-16 DIAGNOSIS — O0991 Supervision of high risk pregnancy, unspecified, first trimester: Secondary | ICD-10-CM | POA: Diagnosis not present

## 2018-07-16 DIAGNOSIS — Z3A35 35 weeks gestation of pregnancy: Secondary | ICD-10-CM | POA: Diagnosis not present

## 2018-07-16 DIAGNOSIS — O0993 Supervision of high risk pregnancy, unspecified, third trimester: Secondary | ICD-10-CM

## 2018-07-16 DIAGNOSIS — Z3685 Encounter for antenatal screening for Streptococcus B: Secondary | ICD-10-CM

## 2018-07-16 NOTE — Progress Notes (Signed)
ROB at 35wk6 days: Complains of sharp pains in vagina and intermittent bilateral round ligament pain particularly when up walking. Good FM. Occasional contractions FH 40 cm FHT WNL GBS done. CS and BTL scheduled for 08/08/2017 Has an ultrasound scheduled for 22 December for Size>dates.  Farrel Connersolleen Kimari Lienhard, CNM

## 2018-07-16 NOTE — Progress Notes (Signed)
ROB C/o sharp pain in pelvic area, c a lot of pressure Good FM, denies lof

## 2018-07-18 LAB — STREP GP B NAA: STREP GROUP B AG: POSITIVE — AB

## 2018-07-22 ENCOUNTER — Ambulatory Visit (INDEPENDENT_AMBULATORY_CARE_PROVIDER_SITE_OTHER): Payer: BLUE CROSS/BLUE SHIELD

## 2018-07-22 ENCOUNTER — Ambulatory Visit (INDEPENDENT_AMBULATORY_CARE_PROVIDER_SITE_OTHER): Payer: BLUE CROSS/BLUE SHIELD | Admitting: Obstetrics & Gynecology

## 2018-07-22 VITALS — BP 100/70 | Wt 206.0 lb

## 2018-07-22 DIAGNOSIS — Z98891 History of uterine scar from previous surgery: Secondary | ICD-10-CM

## 2018-07-22 DIAGNOSIS — Z87728 Personal history of other specified (corrected) congenital malformations of nervous system and sense organs: Secondary | ICD-10-CM

## 2018-07-22 DIAGNOSIS — Z3A36 36 weeks gestation of pregnancy: Secondary | ICD-10-CM

## 2018-07-22 DIAGNOSIS — O0991 Supervision of high risk pregnancy, unspecified, first trimester: Secondary | ICD-10-CM

## 2018-07-22 DIAGNOSIS — Z362 Encounter for other antenatal screening follow-up: Secondary | ICD-10-CM | POA: Diagnosis not present

## 2018-07-22 DIAGNOSIS — O0993 Supervision of high risk pregnancy, unspecified, third trimester: Secondary | ICD-10-CM

## 2018-07-22 NOTE — Progress Notes (Signed)
  Subjective  Fetal Movement? yes Contractions? no Leaking Fluid? no Vaginal Bleeding? no  Objective  BP 100/70   Wt 206 lb (93.4 kg)   LMP 10/22/2017 (LMP Unknown)   BMI 40.23 kg/m  General: NAD Pumonary: no increased work of breathing Abdomen: gravid, non-tender Extremities: no edema Psychiatric: mood appropriate, affect full  Assessment  27 y.o. [id number] at 7622w5d by  08/14/2018, by Ultrasound presenting for routine prenatal visit  Plan   Problem List Items Addressed This Visit      Other   History of cesarean delivery   Supervision of high risk pregnancy, antepartum, first trimester    Other Visit Diagnoses    [redacted] weeks gestation of pregnancy    -  Primary   History of anencephaly        PNV, FMC, Labor precautions, planning for CS BTL Review of ULTRASOUND.    [facility]    Plan of management to be discussed with patient.    Growth discussed  Annamarie MajorPaul Rowan Blaker, MD, Merlinda FrederickFACOG [facility], [facility] 07/22/2018  3:46 PM

## 2018-07-29 ENCOUNTER — Encounter: Payer: Self-pay | Admitting: Obstetrics and Gynecology

## 2018-07-29 ENCOUNTER — Ambulatory Visit (INDEPENDENT_AMBULATORY_CARE_PROVIDER_SITE_OTHER): Payer: BLUE CROSS/BLUE SHIELD | Admitting: Obstetrics and Gynecology

## 2018-07-29 VITALS — BP 110/60 | Wt 208.0 lb

## 2018-07-29 DIAGNOSIS — Z98891 History of uterine scar from previous surgery: Secondary | ICD-10-CM

## 2018-07-29 DIAGNOSIS — O0991 Supervision of high risk pregnancy, unspecified, first trimester: Secondary | ICD-10-CM

## 2018-07-29 DIAGNOSIS — Z3A37 37 weeks gestation of pregnancy: Secondary | ICD-10-CM

## 2018-07-29 DIAGNOSIS — O34219 Maternal care for unspecified type scar from previous cesarean delivery: Secondary | ICD-10-CM

## 2018-07-29 LAB — POCT URINALYSIS DIPSTICK OB: Glucose, UA: NEGATIVE

## 2018-07-29 NOTE — Progress Notes (Signed)
    Routine Prenatal Care Visit  Subjective  Deanna Vaughn is a 27 y.o. [phone] at 5043w5d being seen today for ongoing prenatal care.  She is currently monitored for the following issues for this high-risk pregnancy and has Family history of anencephaly; Abdominal pain affecting pregnancy, antepartum; History of cesarean delivery; S/P cesarean section; and Supervision of high risk pregnancy, antepartum, first trimester on their problem list.  ----------------------------------------------------------------------------------- Patient reports no complaints.   Contractions: Irregular. Vag. Bleeding: None.  Movement: Present. Denies leaking of fluid.  ----------------------------------------------------------------------------------- The following portions of the patient's history were reviewed and updated as appropriate: allergies, current medications, past family history, past medical history, past social history, past surgical history and problem list. Problem list updated.   Objective  Blood pressure 110/60, weight 208 lb (94.3 kg), last menstrual period 10/22/2017, unknown if currently breastfeeding. Pregravid weight 175 lb (79.4 kg) Total Weight Gain 33 lb (15 kg) Urinalysis:      Fetal Status: Fetal Heart Rate (bpm): 150 Fundal Height: 40 cm Movement: Present     General:  Alert, oriented and cooperative. Patient is in no acute distress.  Skin: Skin is warm and dry. No rash noted.   Cardiovascular: Normal heart rate noted  Respiratory: Normal respiratory effort, no problems with respiration noted  Abdomen: Soft, gravid, appropriate for gestational age. Pain/Pressure: Present     Pelvic:  Cervical exam deferred        Extremities: Normal range of motion.  Edema: None  Mental Status: Normal mood and affect. Normal behavior. Normal judgment and thought content.     Assessment   27 y.o. [id number] at 4243w5d by  08/14/2018, by Ultrasound presenting for routine prenatal visit  Plan    pregnancy4 Problems (from 10/22/17 to present)    Problem Noted Resolved   Supervision of high risk pregnancy, antepartum, first trimester 01/10/2018 by Nadara MustardHarris, Robert P, MD No   Overview Addendum 07/19/2018 12:00 PM by Farrel ConnersGutierrez, Colleen, CNM      [facility] Prenatal Labs  Dating  9 wk US Blood type: B/Positive/-- (06/13 1131)   Genetic Screen Declines Antibody:Negative (06/13 1131)  Anatomic US normal Rubella: 5.30 (06/13 1131) Varicella: Immune  GTT 28 wk:  normal RPR: Non Reactive (10/18 1522)   Rhogam  not needed HBsAg: Negative (06/13 1131)   TDaP vaccine    06/10/18                   HIV: Non Reactive (10/18 1522)   Flu Shot    Declines                            GBS: positive  Contraception  Desires tubal Pap: NIL 2019  CBB     CS/VBAC  Desires RLTCS and BTL   Baby Food  Bottle   Support Person                Gestational age appropriate obstetric precautions including but not limited to vaginal bleeding, contractions, leaking of fluid and fetal movement were reviewed in detail with the patient.    Return in about 1 week (around 08/05/2018) for ROB.  Natale Milchhristanna R Genova Kiner MD [facility], [facility] 07/29/2018, 11:42 AM

## 2018-07-29 NOTE — Progress Notes (Signed)
ROB C/o pelvic pressure, cramping, good FM, denies lof.

## 2018-08-07 ENCOUNTER — Encounter
Admission: RE | Admit: 2018-08-07 | Discharge: 2018-08-07 | Disposition: A | Discharge status: Home / Self Care | Payer: BLUE CROSS/BLUE SHIELD | Source: Ambulatory Visit | Attending: Obstetrics & Gynecology | Admitting: Obstetrics & Gynecology

## 2018-08-07 ENCOUNTER — Ambulatory Visit (INDEPENDENT_AMBULATORY_CARE_PROVIDER_SITE_OTHER): Payer: BLUE CROSS/BLUE SHIELD | Admitting: Obstetrics & Gynecology

## 2018-08-07 ENCOUNTER — Encounter: Payer: Self-pay | Admitting: Obstetrics & Gynecology

## 2018-08-07 ENCOUNTER — Other Ambulatory Visit: Payer: Self-pay

## 2018-08-07 VITALS — BP 120/80 | Ht 60.0 in | Wt 215.0 lb

## 2018-08-07 DIAGNOSIS — Z3A39 39 weeks gestation of pregnancy: Secondary | ICD-10-CM

## 2018-08-07 DIAGNOSIS — Z01812 Encounter for preprocedural laboratory examination: Secondary | ICD-10-CM | POA: Insufficient documentation

## 2018-08-07 DIAGNOSIS — O34211 Maternal care for low transverse scar from previous cesarean delivery: Secondary | ICD-10-CM | POA: Diagnosis not present

## 2018-08-07 DIAGNOSIS — O9963 Diseases of the digestive system complicating the puerperium: Secondary | ICD-10-CM | POA: Diagnosis not present

## 2018-08-07 DIAGNOSIS — O9081 Anemia of the puerperium: Secondary | ICD-10-CM | POA: Diagnosis not present

## 2018-08-07 DIAGNOSIS — Z3009 Encounter for other general counseling and advice on contraception: Secondary | ICD-10-CM

## 2018-08-07 DIAGNOSIS — Z98891 History of uterine scar from previous surgery: Secondary | ICD-10-CM

## 2018-08-07 DIAGNOSIS — D62 Acute posthemorrhagic anemia: Secondary | ICD-10-CM | POA: Diagnosis not present

## 2018-08-07 DIAGNOSIS — K567 Ileus, unspecified: Secondary | ICD-10-CM | POA: Diagnosis not present

## 2018-08-07 DIAGNOSIS — O34219 Maternal care for unspecified type scar from previous cesarean delivery: Secondary | ICD-10-CM | POA: Diagnosis not present

## 2018-08-07 DIAGNOSIS — Z302 Encounter for sterilization: Secondary | ICD-10-CM | POA: Diagnosis not present

## 2018-08-07 LAB — CBC
HCT: 32.1 % — ABNORMAL LOW (ref 36.0–46.0)
Hemoglobin: 10.1 g/dL — ABNORMAL LOW (ref 12.0–15.0)
MCH: 26 pg (ref 26.0–34.0)
MCHC: 31.5 g/dL (ref 30.0–36.0)
MCV: 82.7 fL (ref 80.0–100.0)
Platelets: 181 10*3/uL (ref 150–400)
RBC: 3.88 MIL/uL (ref 3.87–5.11)
RDW: 13.3 % (ref 11.5–15.5)
WBC: 8.2 10*3/uL (ref 4.0–10.5)
nRBC: 0 % (ref 0.0–0.2)

## 2018-08-07 NOTE — Patient Instructions (Signed)

## 2018-08-07 NOTE — H&P (View-Only) (Signed)
PRE-OPERATIVE HISTORY AND PHYSICAL EXAM  HPI:  Deanna Vaughn is a 28 y.o. [id number].  Patient's last menstrual period was 10/22/2017 (lmp unknown).  [redacted]w[redacted]d Estimated Date of Delivery: 08/14/18  She is being admitted for Elective repeat and tubal ligation for sterility.  See prenatal records.    PMHx: She  has a past medical history of Anemia. Also,  has a past surgical history that includes Cesarean section (2009) and Cesarean section with bilateral tubal ligation (N/A, 11/18/2015)., family history includes Diabetes in her mother; Hypertension in her mother.,  reports that she has never smoked. She has never used smokeless tobacco. She reports that she does not drink alcohol or use drugs. OB History  Gravida Para Term Preterm AB Living  4 3 3     2   SAB TAB Ectopic Multiple Live Births        0 3    # Outcome Date GA Lbr Len/2nd Weight Sex Delivery Anes PTL Lv  4 Current           3 Term 11/18/15 [redacted]w[redacted]d  7 lb 13.9 oz (3.57 kg) M CS-LTranv Spinal  LIV  2 Term 03/26/08 [redacted]w[redacted]d   F CS-LTranv   LIV  1 Term 04/27/06 [redacted]w[redacted]d   M Vag-Vacuum   ND     Complications: Anencephaly  Patient denies any other pertinent gynecologic issues. See prenatal record for more complete H&P  No current outpatient medications on file. Also, has No Known Allergies.  Review of Systems  Constitutional: Negative for chills, fever and malaise/fatigue.  HENT: Negative for congestion, sinus pain and sore throat.   Eyes: Negative for blurred vision and pain.  Respiratory: Negative for cough and wheezing.   Cardiovascular: Negative for chest pain and leg swelling.  Gastrointestinal: Negative for abdominal pain, constipation, diarrhea, heartburn, nausea and vomiting.  Genitourinary: Negative for dysuria, frequency, hematuria and urgency.  Musculoskeletal: Negative for back pain, joint pain, myalgias and neck pain.  Skin: Negative for itching and rash.  Neurological: Negative for dizziness, tremors and weakness.    Endo/Heme/Allergies: Does not bruise/bleed easily.  Psychiatric/Behavioral: Negative for depression. The patient is not nervous/anxious and does not have insomnia.     Objective: LMP 10/22/2017 (LMP Unknown)  There were no vitals filed for this visit. Physical Exam Constitutional:      General: She is not in acute distress.    Appearance: She is well-developed.  HENT:     Head: Normocephalic and atraumatic. No laceration.     Right Ear: Hearing normal.     Left Ear: Hearing normal.     Mouth/Throat:     Pharynx: Uvula midline.  Eyes:     Pupils: Pupils are equal, round, and reactive to light.  Neck:     Musculoskeletal: Normal range of motion and neck supple.     Thyroid: No thyromegaly.  Cardiovascular:     Rate and Rhythm: Normal rate and regular rhythm.     Heart sounds: No murmur. No friction rub. No gallop.   Pulmonary:     Effort: Pulmonary effort is normal. No respiratory distress.     Breath sounds: Normal breath sounds. No wheezing.  Chest:     Breasts:        Right: No mass, skin change or tenderness.        Left: No mass, skin change or tenderness.  Abdominal:     General: Bowel sounds are normal. There is no distension.     Palpations:  Abdomen is soft.     Tenderness: There is no abdominal tenderness. There is no rebound.     Comments: Gravid, NT FHT 140s  Musculoskeletal: Normal range of motion.  Neurological:     Mental Status: She is alert and oriented to person, place, and time.     Cranial Nerves: No cranial nerve deficit.  Skin:    General: Skin is warm and dry.  Psychiatric:        Judgment: Judgment normal.  Vitals signs reviewed.   Assessment: 1. [redacted] weeks gestation of pregnancy   2. History of cesarean delivery   3. Sterilization consult    PLAN: 1.  Cesarean Delivery as Scheduled. Also Tubal Ligation.  Patient will undergo surgical management with Cesarean Section.   The risks of surgery were discussed in detail with the patient  including but not limited to: bleeding which may require transfusion or reoperation; infection which may require antibiotics; injury to surrounding organs which may involve bowel, bladder, ureters ; need for additional procedures including laparoscopy or laparotomy; thromboembolic phenomenon, surgical site problems and other postoperative/anesthesia complications. Likelihood of success in alleviating the patient's condition was discussed. Routine postoperative instructions will be reviewed with the patient and her family in detail after surgery.  The patient concurred with the proposed plan, giving informed written consent for the surgery.  Patient will be NPO procedure.  Preoperative prophylactic antibiotics, as necessary, and SCDs ordered on call to the OR.  The patient has been fully informed about all methods of contraception, both temporary and permanent. She understands that tubal ligation is meant to be permanent, absolute and irreversible. She was told that there is an approximately 1 in 400 chance of a pregnancy in the future after tubal ligation. She was told the short and long term complications of tubal ligation. She understands the risks from this surgery include, but are not limited to, the risks of anesthesia, hemorrhage, infection, perforation, and injury to adjacent structures, bowel, bladder and blood vessels.   Plans to bottle feed. Declines flu shot.  TDaP UTD.  Annamarie Major, M.D. 08/07/2018 8:21 AM

## 2018-08-07 NOTE — Patient Instructions (Addendum)
Your procedure is scheduled on: 08/08/17 Thurs Report to the emergency room at 5:45 am Remember: Instructions that are not followed completely may result in serious medical risk, up to and including death, or upon the discretion of your surgeon and anesthesiologist your surgery may need to be rescheduled.    _x___ 1. Do not eat food after midnight the night before your procedure. You may drink clear liquids up to 2 hours before you are scheduled to arrive at the hospital for your procedure.  Do not drink clear liquids within 2 hours of your scheduled arrival to the hospital.  Clear liquids include  --Water or Apple juice without pulp  --Clear carbohydrate beverage such as ClearFast or Gatorade  --Black Coffee or Clear Tea (No milk, no creamers, do not add anything to                  the coffee or Tea Type 1 and type 2 diabetics should only drink water.   ____Ensure clear carbohydrate drink on the way to the hospital for bariatric patients  ____Ensure clear carbohydrate drink 3 hours before surgery for Dr Rutherford Nail patients if physician instructed.   No gum chewing or hard candies.     __x__ 2. No Alcohol for 24 hours before or after surgery.   __x__3. No Smoking or e-cigarettes for 24 prior to surgery.  Do not use any chewable tobacco products for at least 6 hour prior to surgery   ____  4. Bring all medications with you on the day of surgery if instructed.    __x__ 5. Notify your doctor if there is any change in your medical condition     (cold, fever, infections).    x___6. On the morning of surgery brush your teeth with toothpaste and water.  You may rinse your mouth with mouth wash if you wish.  Do not swallow any toothpaste or mouthwash.   Do not wear jewelry, make-up, hairpins, clips or nail polish.  Do not wear lotions, powders, or perfumes. You may wear deodorant.  Do not shave 48 hours prior to surgery. Men may shave face and neck.  Do not bring valuables to the hospital.     [facility] is not responsible for any belongings or valuables.               Contacts, dentures or bridgework may not be worn into surgery.  Leave your suitcase in the car. After surgery it may be brought to your room.  For patients admitted to the hospital, discharge time is determined by your                       treatment team.  _  Patients discharged the day of surgery will not be allowed to drive home.  You will need someone to drive you home and stay with you the night of your procedure.    Please read over the following fact sheets that you were given:   [facility] Preparing for Surgery and or MRSA Information   _x___ Take anti-hypertensive listed below, cardiac, seizure, asthma,     anti-reflux and psychiatric medicines. These include:  1. None  2.  3.  4.  5.  6.  ____Fleets enema or Magnesium Citrate as directed.   _x___ Use CHG Soap or sage wipes as directed on instruction sheet   ____ Use inhalers on the day of surgery and bring to hospital day of surgery  ____ Stop Metformin  and Janumet 2 days prior to surgery.    ____ Take 1/2 of usual insulin dose the night before surgery and none on the morning     surgery.   _x___ Follow recommendations from Cardiologist, Pulmonologist or PCP regarding          stopping Aspirin, Coumadin, Plavix ,Eliquis, Effient, or Pradaxa, and Pletal.  X____Stop Anti-inflammatories such as Advil, Aleve, Ibuprofen, Motrin, Naproxen, Naprosyn, Goodies powders or aspirin products. OK to take Tylenol and                          Celebrex.   _x___ Stop supplements until after surgery.  But may continue Vitamin D, Vitamin B,       and multivitamin.   ____ Bring C-Pap to the hospital.

## 2018-08-07 NOTE — Progress Notes (Signed)
    PRE-OPERATIVE HISTORY AND PHYSICAL EXAM  HPI:  Deanna Vaughn is a 27 y.o. G4P3002.  Patient's last menstrual period was 10/22/2017 (lmp unknown).  [redacted]w[redacted]d Estimated Date of Delivery: 08/14/18  She is being admitted for Elective repeat and tubal ligation for sterility.  See prenatal records.    PMHx: She  has a past medical history of Anemia. Also,  has a past surgical history that includes Cesarean section (2009) and Cesarean section with bilateral tubal ligation (N/A, 11/18/2015)., family history includes Diabetes in her mother; Hypertension in her mother.,  reports that she has never smoked. She has never used smokeless tobacco. She reports that she does not drink alcohol or use drugs. OB History  Gravida Para Term Preterm AB Living  4 3 3     2  SAB TAB Ectopic Multiple Live Births        0 3    # Outcome Date GA Lbr Len/2nd Weight Sex Delivery Anes PTL Lv  4 Current           3 Term 11/18/15 [redacted]w[redacted]d  7 lb 13.9 oz (3.57 kg) M CS-LTranv Spinal  LIV  2 Term 03/26/08 [redacted]w[redacted]d   F CS-LTranv   LIV  1 Term 04/27/06 [redacted]w[redacted]d   M Vag-Vacuum   ND     Complications: Anencephaly  Patient denies any other pertinent gynecologic issues. See prenatal record for more complete H&P  No current outpatient medications on file. Also, has No Known Allergies.  Review of Systems  Constitutional: Negative for chills, fever and malaise/fatigue.  HENT: Negative for congestion, sinus pain and sore throat.   Eyes: Negative for blurred vision and pain.  Respiratory: Negative for cough and wheezing.   Cardiovascular: Negative for chest pain and leg swelling.  Gastrointestinal: Negative for abdominal pain, constipation, diarrhea, heartburn, nausea and vomiting.  Genitourinary: Negative for dysuria, frequency, hematuria and urgency.  Musculoskeletal: Negative for back pain, joint pain, myalgias and neck pain.  Skin: Negative for itching and rash.  Neurological: Negative for dizziness, tremors and weakness.    Endo/Heme/Allergies: Does not bruise/bleed easily.  Psychiatric/Behavioral: Negative for depression. The patient is not nervous/anxious and does not have insomnia.     Objective: LMP 10/22/2017 (LMP Unknown)  There were no vitals filed for this visit. Physical Exam Constitutional:      General: She is not in acute distress.    Appearance: She is well-developed.  HENT:     Head: Normocephalic and atraumatic. No laceration.     Right Ear: Hearing normal.     Left Ear: Hearing normal.     Mouth/Throat:     Pharynx: Uvula midline.  Eyes:     Pupils: Pupils are equal, round, and reactive to light.  Neck:     Musculoskeletal: Normal range of motion and neck supple.     Thyroid: No thyromegaly.  Cardiovascular:     Rate and Rhythm: Normal rate and regular rhythm.     Heart sounds: No murmur. No friction rub. No gallop.   Pulmonary:     Effort: Pulmonary effort is normal. No respiratory distress.     Breath sounds: Normal breath sounds. No wheezing.  Chest:     Breasts:        Right: No mass, skin change or tenderness.        Left: No mass, skin change or tenderness.  Abdominal:     General: Bowel sounds are normal. There is no distension.     Palpations:   Abdomen is soft.     Tenderness: There is no abdominal tenderness. There is no rebound.     Comments: Gravid, NT FHT 140s  Musculoskeletal: Normal range of motion.  Neurological:     Mental Status: She is alert and oriented to person, place, and time.     Cranial Nerves: No cranial nerve deficit.  Skin:    General: Skin is warm and dry.  Psychiatric:        Judgment: Judgment normal.  Vitals signs reviewed.   Assessment: 1. [redacted] weeks gestation of pregnancy   2. History of cesarean delivery   3. Sterilization consult    PLAN: 1.  Cesarean Delivery as Scheduled. Also Tubal Ligation.  Patient will undergo surgical management with Cesarean Section.   The risks of surgery were discussed in detail with the patient  including but not limited to: bleeding which may require transfusion or reoperation; infection which may require antibiotics; injury to surrounding organs which may involve bowel, bladder, ureters ; need for additional procedures including laparoscopy or laparotomy; thromboembolic phenomenon, surgical site problems and other postoperative/anesthesia complications. Likelihood of success in alleviating the patient's condition was discussed. Routine postoperative instructions will be reviewed with the patient and her family in detail after surgery.  The patient concurred with the proposed plan, giving informed written consent for the surgery.  Patient will be NPO procedure.  Preoperative prophylactic antibiotics, as necessary, and SCDs ordered on call to the OR.  The patient has been fully informed about all methods of contraception, both temporary and permanent. She understands that tubal ligation is meant to be permanent, absolute and irreversible. She was told that there is an approximately 1 in 400 chance of a pregnancy in the future after tubal ligation. She was told the short and long term complications of tubal ligation. She understands the risks from this surgery include, but are not limited to, the risks of anesthesia, hemorrhage, infection, perforation, and injury to adjacent structures, bowel, bladder and blood vessels.   Plans to bottle feed. Declines flu shot.  TDaP UTD.  Annamarie Major, M.D. 08/07/2018 8:21 AM

## 2018-08-08 ENCOUNTER — Inpatient Hospital Stay: Payer: BLUE CROSS/BLUE SHIELD | Admitting: Anesthesiology

## 2018-08-08 ENCOUNTER — Other Ambulatory Visit: Payer: Self-pay

## 2018-08-08 ENCOUNTER — Encounter
Admission: RE | Disposition: A | Discharge status: Home / Self Care | Payer: Self-pay | Source: Home / Self Care | Attending: Obstetrics & Gynecology

## 2018-08-08 ENCOUNTER — Inpatient Hospital Stay
Admission: RE | Admit: 2018-08-08 | Discharge: 2018-08-10 | DRG: 784 | Disposition: A | Discharge status: Home / Self Care | Payer: BLUE CROSS/BLUE SHIELD | Attending: Obstetrics & Gynecology | Admitting: Obstetrics & Gynecology

## 2018-08-08 DIAGNOSIS — Z3A39 39 weeks gestation of pregnancy: Secondary | ICD-10-CM | POA: Diagnosis not present

## 2018-08-08 DIAGNOSIS — K567 Ileus, unspecified: Secondary | ICD-10-CM | POA: Diagnosis not present

## 2018-08-08 DIAGNOSIS — D62 Acute posthemorrhagic anemia: Secondary | ICD-10-CM | POA: Diagnosis not present

## 2018-08-08 DIAGNOSIS — Z98891 History of uterine scar from previous surgery: Secondary | ICD-10-CM

## 2018-08-08 DIAGNOSIS — O9081 Anemia of the puerperium: Secondary | ICD-10-CM | POA: Diagnosis not present

## 2018-08-08 DIAGNOSIS — O0991 Supervision of high risk pregnancy, unspecified, first trimester: Secondary | ICD-10-CM

## 2018-08-08 DIAGNOSIS — Z302 Encounter for sterilization: Secondary | ICD-10-CM | POA: Diagnosis not present

## 2018-08-08 DIAGNOSIS — O9963 Diseases of the digestive system complicating the puerperium: Secondary | ICD-10-CM | POA: Diagnosis not present

## 2018-08-08 DIAGNOSIS — O34211 Maternal care for low transverse scar from previous cesarean delivery: Principal | ICD-10-CM | POA: Diagnosis present

## 2018-08-08 DIAGNOSIS — O34219 Maternal care for unspecified type scar from previous cesarean delivery: Secondary | ICD-10-CM | POA: Diagnosis not present

## 2018-08-08 LAB — SAMPLE TO BLOOD BANK

## 2018-08-08 LAB — CBC
HCT: 25.2 % — ABNORMAL LOW (ref 36.0–46.0)
HCT: 27.4 % — ABNORMAL LOW (ref 36.0–46.0)
HEMOGLOBIN: 7.6 g/dL — AB (ref 12.0–15.0)
Hemoglobin: 8.6 g/dL — ABNORMAL LOW (ref 12.0–15.0)
MCH: 26 pg (ref 26.0–34.0)
MCH: 26.1 pg (ref 26.0–34.0)
MCHC: 30.2 g/dL (ref 30.0–36.0)
MCHC: 31.4 g/dL (ref 30.0–36.0)
MCV: 86.3 fL (ref 80.0–100.0)
MCV: 83 fL (ref 80.0–100.0)
PLATELETS: 181 10*3/uL (ref 150–400)
Platelets: 173 10*3/uL (ref 150–400)
RBC: 2.92 MIL/uL — ABNORMAL LOW (ref 3.87–5.11)
RBC: 3.3 MIL/uL — ABNORMAL LOW (ref 3.87–5.11)
RDW: 13.3 % (ref 11.5–15.5)
RDW: 13.7 % (ref 11.5–15.5)
WBC: 14.7 10*3/uL — AB (ref 4.0–10.5)
WBC: 10.9 10*3/uL — ABNORMAL HIGH (ref 4.0–10.5)
nRBC: 0 % (ref 0.0–0.2)
nRBC: 0 % (ref 0.0–0.2)

## 2018-08-08 LAB — RPR: RPR Ser Ql: NONREACTIVE

## 2018-08-08 LAB — PREPARE RBC (CROSSMATCH)

## 2018-08-08 SURGERY — Surgical Case
Anesthesia: Spinal

## 2018-08-08 MED ORDER — SODIUM CHLORIDE (PF) 0.9 % IJ SOLN
INTRAMUSCULAR | Status: AC
  Filled 2018-08-08: qty 50

## 2018-08-08 MED ORDER — OXYTOCIN 40 UNITS IN NORMAL SALINE INFUSION - SIMPLE MED
2.5000 [IU]/h | INTRAVENOUS | Status: AC
  Filled 2018-08-08: qty 1000

## 2018-08-08 MED ORDER — SIMETHICONE 80 MG PO CHEW
80.0000 mg | CHEWABLE_TABLET | ORAL | Status: DC
  Administered 2018-08-08 – 2018-08-10 (×2): 80 mg via ORAL
  Filled 2018-08-08 (×2): qty 1

## 2018-08-08 MED ORDER — ACETAMINOPHEN 10 MG/ML IV SOLN
INTRAVENOUS | Status: AC
  Filled 2018-08-08: qty 100

## 2018-08-08 MED ORDER — SODIUM CHLORIDE 0.9 % IV BOLUS
1000.0000 mL | Freq: Once | INTRAVENOUS | Status: AC
  Administered 2018-08-08: 1000 mL via INTRAVENOUS

## 2018-08-08 MED ORDER — LACTATED RINGERS IV SOLN
INTRAVENOUS | Status: DC
  Administered 2018-08-08 – 2018-08-10 (×6): via INTRAVENOUS

## 2018-08-08 MED ORDER — DIBUCAINE 1 % RE OINT: 1.0000 "application " | TOPICAL_OINTMENT | RECTAL | Status: DC | PRN

## 2018-08-08 MED ORDER — DIPHENHYDRAMINE HCL 50 MG/ML IJ SOLN: 12.5000 mg | INTRAMUSCULAR | Status: DC | PRN

## 2018-08-08 MED ORDER — ACETAMINOPHEN 10 MG/ML IV SOLN
INTRAVENOUS | Status: DC | PRN
  Administered 2018-08-08: 1000 mg via INTRAVENOUS

## 2018-08-08 MED ORDER — KETOROLAC TROMETHAMINE 30 MG/ML IJ SOLN
30.0000 mg | Freq: Four times a day (QID) | INTRAMUSCULAR | Status: AC
  Administered 2018-08-09: 30 mg via INTRAMUSCULAR

## 2018-08-08 MED ORDER — SODIUM CHLORIDE 0.9 % IV SOLN
INTRAVENOUS | Status: AC
  Administered 2018-08-08: 2 g via INTRAVENOUS
  Filled 2018-08-08: qty 2

## 2018-08-08 MED ORDER — NALBUPHINE HCL 10 MG/ML IJ SOLN: 5.0000 mg | Freq: Once | INTRAMUSCULAR | Status: DC | PRN

## 2018-08-08 MED ORDER — PHENYLEPHRINE HCL 10 MG/ML IJ SOLN
INTRAMUSCULAR | Status: AC
  Filled 2018-08-08: qty 1

## 2018-08-08 MED ORDER — ONDANSETRON HCL 4 MG/2ML IJ SOLN
4.0000 mg | Freq: Four times a day (QID) | INTRAMUSCULAR | Status: DC | PRN
  Administered 2018-08-08: 4 mg via INTRAVENOUS

## 2018-08-08 MED ORDER — DIPHENHYDRAMINE HCL 25 MG PO CAPS: 25.0000 mg | ORAL_CAPSULE | ORAL | Status: DC | PRN

## 2018-08-08 MED ORDER — OXYCODONE HCL 5 MG PO TABS: 10.0000 mg | ORAL_TABLET | ORAL | Status: DC | PRN

## 2018-08-08 MED ORDER — SENNOSIDES-DOCUSATE SODIUM 8.6-50 MG PO TABS
2.0000 | ORAL_TABLET | ORAL | Status: DC
  Administered 2018-08-08: 2 via ORAL
  Filled 2018-08-08 (×2): qty 2

## 2018-08-08 MED ORDER — AMMONIA AROMATIC IN INHA
RESPIRATORY_TRACT | Status: AC
  Filled 2018-08-08: qty 10

## 2018-08-08 MED ORDER — SODIUM CHLORIDE 0.9% IV SOLUTION
Freq: Once | INTRAVENOUS | Status: AC
  Administered 2018-08-08: 15:00:00 via INTRAVENOUS

## 2018-08-08 MED ORDER — NALBUPHINE HCL 10 MG/ML IJ SOLN: 5.0000 mg | INTRAMUSCULAR | Status: DC | PRN

## 2018-08-08 MED ORDER — OXYTOCIN 40 UNITS IN NORMAL SALINE INFUSION - SIMPLE MED
INTRAVENOUS | Status: AC
  Filled 2018-08-08: qty 1000

## 2018-08-08 MED ORDER — ONDANSETRON HCL 4 MG/2ML IJ SOLN
INTRAMUSCULAR | Status: AC
  Filled 2018-08-08: qty 2

## 2018-08-08 MED ORDER — SODIUM CHLORIDE 0.9 % IV SOLN
2.0000 g | Freq: Once | INTRAVENOUS | Status: AC
  Administered 2018-08-08: 2 g via INTRAVENOUS

## 2018-08-08 MED ORDER — BUPIVACAINE HCL 0.5 % IJ SOLN: 10.0000 mL | Freq: Once | INTRAMUSCULAR | Status: DC

## 2018-08-08 MED ORDER — OXYCODONE HCL 5 MG PO TABS: 5.0000 mg | ORAL_TABLET | ORAL | Status: DC | PRN

## 2018-08-08 MED ORDER — SIMETHICONE 80 MG PO CHEW
80.0000 mg | CHEWABLE_TABLET | Freq: Three times a day (TID) | ORAL | Status: DC
  Administered 2018-08-08 – 2018-08-10 (×5): 80 mg via ORAL
  Filled 2018-08-08 (×5): qty 1

## 2018-08-08 MED ORDER — PROPOFOL 10 MG/ML IV BOLUS
INTRAVENOUS | Status: AC
  Filled 2018-08-08: qty 20

## 2018-08-08 MED ORDER — BUPIVACAINE HCL (PF) 0.5 % IJ SOLN
INTRAMUSCULAR | Status: DC | PRN
  Administered 2018-08-08: 1.6 mL

## 2018-08-08 MED ORDER — GLYCOPYRROLATE 0.2 MG/ML IJ SOLN
INTRAMUSCULAR | Status: DC | PRN
  Administered 2018-08-08: 0.2 mg via INTRAVENOUS

## 2018-08-08 MED ORDER — MISOPROSTOL 200 MCG PO TABS
ORAL_TABLET | ORAL | Status: AC
  Filled 2018-08-08: qty 4

## 2018-08-08 MED ORDER — BUPIVACAINE 0.25 % ON-Q PUMP DUAL CATH 400 ML
400.0000 mL | INJECTION | Status: DC
  Filled 2018-08-08: qty 400

## 2018-08-08 MED ORDER — FENTANYL CITRATE (PF) 100 MCG/2ML IJ SOLN
INTRAMUSCULAR | Status: AC
  Filled 2018-08-08: qty 2

## 2018-08-08 MED ORDER — WITCH HAZEL-GLYCERIN EX PADS: 1.0000 "application " | MEDICATED_PAD | CUTANEOUS | Status: DC | PRN

## 2018-08-08 MED ORDER — BUPIVACAINE HCL (PF) 0.5 % IJ SOLN
INTRAMUSCULAR | Status: AC
  Filled 2018-08-08: qty 30

## 2018-08-08 MED ORDER — ONDANSETRON HCL 4 MG/2ML IJ SOLN
INTRAMUSCULAR | Status: DC | PRN
  Administered 2018-08-08: 4 mg via INTRAVENOUS

## 2018-08-08 MED ORDER — SIMETHICONE 80 MG PO CHEW: 80.0000 mg | CHEWABLE_TABLET | ORAL | Status: DC | PRN

## 2018-08-08 MED ORDER — LACTATED RINGERS IV SOLN: INTRAVENOUS | Status: DC

## 2018-08-08 MED ORDER — LACTATED RINGERS IV SOLN
INTRAVENOUS | Status: DC
  Administered 2018-08-08: 06:00:00 via INTRAVENOUS

## 2018-08-08 MED ORDER — MORPHINE SULFATE (PF) 0.5 MG/ML IJ SOLN
INTRAMUSCULAR | Status: AC
  Filled 2018-08-08: qty 10

## 2018-08-08 MED ORDER — COCONUT OIL OIL: 1.0000 "application " | TOPICAL_OIL | Status: DC | PRN

## 2018-08-08 MED ORDER — BUPIVACAINE ON-Q PAIN PUMP (FOR ORDER SET NO CHG)
INJECTION | Status: DC
  Filled 2018-08-08: qty 1

## 2018-08-08 MED ORDER — SODIUM CHLORIDE 0.9 % IV SOLN
INTRAVENOUS | Status: DC | PRN
  Administered 2018-08-08: 25 ug/min via INTRAVENOUS

## 2018-08-08 MED ORDER — OXYTOCIN 40 UNITS IN NORMAL SALINE INFUSION - SIMPLE MED
INTRAVENOUS | Status: AC
  Administered 2018-08-08: 10:00:00
  Filled 2018-08-08: qty 1000

## 2018-08-08 MED ORDER — LACTATED RINGERS IV SOLN
INTRAVENOUS | Status: DC | PRN
  Administered 2018-08-08: 08:00:00 via INTRAVENOUS

## 2018-08-08 MED ORDER — SOD CITRATE-CITRIC ACID 500-334 MG/5ML PO SOLN
ORAL | Status: AC
  Administered 2018-08-08: 30 mL
  Filled 2018-08-08: qty 15

## 2018-08-08 MED ORDER — MEPERIDINE HCL 25 MG/ML IJ SOLN: 6.2500 mg | INTRAMUSCULAR | Status: DC | PRN

## 2018-08-08 MED ORDER — OXYCODONE-ACETAMINOPHEN 5-325 MG PO TABS: 2.0000 | ORAL_TABLET | ORAL | Status: DC | PRN

## 2018-08-08 MED ORDER — OXYTOCIN 40 UNITS IN NORMAL SALINE INFUSION - SIMPLE MED
INTRAVENOUS | Status: DC | PRN
  Administered 2018-08-08: 1000 mL via INTRAVENOUS

## 2018-08-08 MED ORDER — DIPHENHYDRAMINE HCL 25 MG PO CAPS: 25.0000 mg | ORAL_CAPSULE | Freq: Four times a day (QID) | ORAL | Status: DC | PRN

## 2018-08-08 MED ORDER — BUPIVACAINE HCL (PF) 0.5 % IJ SOLN
INTRAMUSCULAR | Status: AC
  Filled 2018-08-08: qty 10

## 2018-08-08 MED ORDER — ACETAMINOPHEN 325 MG PO TABS
650.0000 mg | ORAL_TABLET | Freq: Four times a day (QID) | ORAL | Status: AC
  Administered 2018-08-08 – 2018-08-09 (×3): 650 mg via ORAL
  Filled 2018-08-08 (×4): qty 2

## 2018-08-08 MED ORDER — MORPHINE SULFATE (PF) 0.5 MG/ML IJ SOLN
INTRAMUSCULAR | Status: DC | PRN
  Administered 2018-08-08: .2 mg via EPIDURAL

## 2018-08-08 MED ORDER — MENTHOL 3 MG MT LOZG
1.0000 | LOZENGE | OROMUCOSAL | Status: DC | PRN
  Filled 2018-08-08: qty 9

## 2018-08-08 MED ORDER — FENTANYL CITRATE (PF) 100 MCG/2ML IJ SOLN
INTRAMUSCULAR | Status: DC | PRN
  Administered 2018-08-08: 15 ug via INTRAVENOUS

## 2018-08-08 MED ORDER — BUPIVACAINE HCL 0.5 % IJ SOLN
INTRAMUSCULAR | Status: DC | PRN
  Administered 2018-08-08: 10 mL

## 2018-08-08 MED ORDER — LACTATED RINGERS IV SOLN
INTRAVENOUS | Status: DC
  Administered 2018-08-08: 07:00:00 via INTRAVENOUS

## 2018-08-08 MED ORDER — KETOROLAC TROMETHAMINE 30 MG/ML IJ SOLN
30.0000 mg | Freq: Four times a day (QID) | INTRAMUSCULAR | Status: AC
  Administered 2018-08-08 – 2018-08-09 (×3): 30 mg via INTRAVENOUS
  Filled 2018-08-08 (×4): qty 1

## 2018-08-08 MED ORDER — OXYTOCIN 10 UNIT/ML IJ SOLN
INTRAMUSCULAR | Status: AC
  Filled 2018-08-08: qty 2

## 2018-08-08 MED ORDER — PRENATAL MULTIVITAMIN CH
1.0000 | ORAL_TABLET | Freq: Every day | ORAL | Status: DC
  Administered 2018-08-09: 1 via ORAL
  Filled 2018-08-08: qty 1

## 2018-08-08 SURGICAL SUPPLY — 30 items
BARRIER ADHS 3X4 INTERCEED (GAUZE/BANDAGES/DRESSINGS) ×2 IMPLANT
CANISTER SUCT 3000ML PPV (MISCELLANEOUS) ×3 IMPLANT
CATH KIT ON-Q SILVERSOAK 5 (CATHETERS) ×2 IMPLANT
CATH KIT ON-Q SILVERSOAK 5IN (CATHETERS) ×6 IMPLANT
CHLORAPREP W/TINT 26ML (MISCELLANEOUS) ×6 IMPLANT
COVER WAND RF STERILE (DRAPES) ×3 IMPLANT
DERMABOND ADVANCED (GAUZE/BANDAGES/DRESSINGS) ×2
DERMABOND ADVANCED .7 DNX12 (GAUZE/BANDAGES/DRESSINGS) ×1 IMPLANT
DRESSING SURGICEL FIBRLLR 1X2 (HEMOSTASIS) IMPLANT
DRSG SURGICEL FIBRILLAR 1X2 (HEMOSTASIS) ×3
ELECT CAUTERY BLADE 6.4 (BLADE) ×3 IMPLANT
ELECT REM PT RETURN 9FT ADLT (ELECTROSURGICAL) ×3
ELECTRODE REM PT RTRN 9FT ADLT (ELECTROSURGICAL) ×1 IMPLANT
GLOVE SKINSENSE NS SZ8.0 LF (GLOVE) ×2
GLOVE SKINSENSE STRL SZ8.0 LF (GLOVE) ×1 IMPLANT
GOWN STRL REUS W/ TWL LRG LVL3 (GOWN DISPOSABLE) ×1 IMPLANT
GOWN STRL REUS W/ TWL XL LVL3 (GOWN DISPOSABLE) ×2 IMPLANT
GOWN STRL REUS W/TWL LRG LVL3 (GOWN DISPOSABLE) ×2
GOWN STRL REUS W/TWL XL LVL3 (GOWN DISPOSABLE) ×4
NS IRRIG 1000ML POUR BTL (IV SOLUTION) ×3 IMPLANT
PACK C SECTION AR (MISCELLANEOUS) ×3 IMPLANT
PAD OB MATERNITY 4.3X12.25 (PERSONAL CARE ITEMS) ×3 IMPLANT
PAD PREP 24X41 OB/GYN DISP (PERSONAL CARE ITEMS) ×3 IMPLANT
RETRACTOR TRAXI PANNICULUS (MISCELLANEOUS) IMPLANT
SUT MAXON ABS #0 GS21 30IN (SUTURE) ×6 IMPLANT
SUT VIC AB 0 CT1 36 (SUTURE) ×4 IMPLANT
SUT VIC AB 1 CT1 36 (SUTURE) ×17 IMPLANT
SUT VIC AB 2-0 CT1 36 (SUTURE) ×3 IMPLANT
SUT VIC AB 4-0 FS2 27 (SUTURE) ×3 IMPLANT
TRAXI PANNICULUS RETRACTOR (MISCELLANEOUS) ×2

## 2018-08-08 NOTE — Anesthesia Preprocedure Evaluation (Signed)
Anesthesia Evaluation  Patient identified by MRN, date of birth, ID band Patient awake    Reviewed: Allergy & Precautions, NPO status , Patient's Chart, lab work & pertinent test results  History of Anesthesia Complications Negative for: history of anesthetic complications  Airway Mallampati: III  TM Distance: >3 FB Neck ROM: Full    Dental no notable dental hx.    Pulmonary neg pulmonary ROS, neg sleep apnea, neg COPD,    breath sounds clear to auscultation- rhonchi (-) wheezing      Cardiovascular Exercise Tolerance: Good (-) hypertension(-) CAD, (-) Past MI, (-) Cardiac Stents and (-) CABG  Rhythm:Regular Rate:Normal - Systolic murmurs and - Diastolic murmurs    Neuro/Psych neg Seizures negative neurological ROS  negative psych ROS   GI/Hepatic negative GI ROS, Neg liver ROS,   Endo/Other  negative endocrine ROSneg diabetes  Renal/GU negative Renal ROS     Musculoskeletal negative musculoskeletal ROS (+)   Abdominal (+) + obese, Gravid abdomen  Peds  Hematology  (+) anemia ,   Anesthesia Other Findings Past Medical History: No date: Anemia   Reproductive/Obstetrics (+) Pregnancy                             Lab Results  Component Value Date   WBC 8.2 08/07/2018   HGB 10.1 (L) 08/07/2018   HCT 32.1 (L) 08/07/2018   MCV 82.7 08/07/2018   PLT 181 08/07/2018    Anesthesia Physical Anesthesia Plan  ASA: II  Anesthesia Plan: Spinal   Post-op Pain Management:    Induction:   PONV Risk Score and Plan: 2 and Ondansetron  Airway Management Planned: Natural Airway  Additional Equipment:   Intra-op Plan:   Post-operative Plan:   Informed Consent: I have reviewed the patients History and Physical, chart, labs and discussed the procedure including the risks, benefits and alternatives for the proposed anesthesia with the patient or authorized representative who has indicated  his/her understanding and acceptance.   Dental advisory given  Plan Discussed with: CRNA and Anesthesiologist  Anesthesia Plan Comments:         Anesthesia Quick Evaluation

## 2018-08-08 NOTE — Transfer of Care (Signed)
Immediate Anesthesia Transfer of Care Note  Patient: Deanna Vaughn  Procedure(s) Performed: CESAREAN SECTION WITH BILATERAL TUBAL LIGATION (N/A )  Patient Location: Mother/Baby  Anesthesia Type:Spinal  Level of Consciousness: awake, alert  and oriented  Airway & Oxygen Therapy: Patient Spontanous Breathing  Post-op Assessment: Report given to RN and Post -op Vital signs reviewed and stable  Post vital signs: Reviewed and stable  Last Vitals:  Vitals Value Taken Time  BP 93/54 08/08/2018  9:28 AM  Temp    Pulse 78 08/08/2018  9:28 AM  Resp 18 08/08/2018  9:28 AM  SpO2 97 % 08/08/2018  9:28 AM    Last Pain:  Vitals:   08/08/18 0549  TempSrc: Oral         Complications: No apparent anesthesia complications

## 2018-08-08 NOTE — Progress Notes (Signed)
Subjective:  Still having some nausea with additional episode of emesis.  Pain well controlled.  No shoulder pain.  Minimal lochia.   Objective:  Vital signs in last 24 hours: Temp:  [97.5 F (36.4 C)-98.2 F (36.8 C)] 98.2 F (36.8 C) (01/09 1608) Pulse Rate:  [55-100] 70 (01/09 1608) Resp:  [12-22] 18 (01/09 1608) BP: (73-157)/(34-107) 119/69 (01/09 1608) SpO2:  [94 %-100 %] 100 % (01/09 1608) Weight:  [97.5 kg] 97.5 kg (01/09 0549)    Intake/Output      01/08 0701 - 01/09 0700 01/09 0701 - 01/10 0700   P.O.  768   I.V. (mL/kg)  927.2 (9.5)   IV Piggyback  100   Total Intake(mL/kg)  1795.2 (18.4)   Urine (mL/kg/hr)  350 (0.3)   Emesis/NG output  1100   Blood  1801   Total Output  3251   Net  -1455.9        Emesis Occurrence  1 x     General: NAD Pulmonary: no increased work of breathing Abdomen: Hypoactive BS, non-distended, non-tender, fundus firm at level of umbilicus Incision:D/C/I Extremities: no edema, no erythema, no tenderness  Results for orders placed or performed during the hospital encounter of 08/08/18 (from the past 72 hour(s))  Sample to Blood Bank     Status: None   Collection Time: 08/08/18  6:06 AM  Result Value Ref Range   Blood Bank Specimen SAMPLE AVAILABLE FOR TESTING    Sample Expiration      08/11/2018 Performed at [facility], [address] [place], [place] [address]   Prepare RBC     Status: None   Collection Time: 08/08/18 11:00 AM  Result Value Ref Range   Order Confirmation      ORDER PROCESSED BY BLOOD BANK Performed at [facility], [address] [place], [place] [address]   CBC     Status: Abnormal   Collection Time: 08/08/18  1:49 PM  Result Value Ref Range   WBC 14.7 (H) 4.0 - 10.5 K/uL   RBC 2.92 (L) 3.87 - 5.11 MIL/uL   Hemoglobin 7.6 (L) 12.0 - 15.0 g/dL    Comment: REPEATED TO VERIFY   HCT 25.2 (L) 36.0 - 46.0 %   MCV 86.3 80.0 - 100.0 fL   MCH 26.0 26.0 - 34.0 pg   MCHC 30.2 30.0 - 36.0  g/dL   RDW 82.913.3 56.211.5 - 13.015.5 %   Platelets 173 150 - 400 K/uL   nRBC 0.0 0.0 - 0.2 %    Comment: Performed at [facility], [address] [place], [place] [address]  ABO/Rh     Status: None   Collection Time: 08/08/18  1:49 PM  Result Value Ref Range   ABO/RH(D)      B POS Performed at [facility], [address] [place], [place] [address]     Immunization History  Administered Date(s) Administered  . Tdap 06/10/2018    Assessment:   28 y.o. G4P4003 postoperativeday # 0 RLTCS & BTL   Plan:  ) Acute blood loss anemia - hemodynamically stable and asymptomatic, adequate clear UOP - po ferrous sulfate - received 1U of PRBC for 4-hr postop Hgb drop - post transfusion CBC pending  2) Postoperative ileus - NPO overnight  3) Blood Type --/--/B POS Performed at [facility], [address] [place], [place] [address]  [phone](01/09 1349) / Rubella 5.30 (06/13 1131) / Varicella Immune  4) TDAP status up  to date  5)  Education given regarding options for contraception, as well as compatibility with breast feeding if applicable.  Patient s/p BTL  6) Disposition  Vena Austria, MD, [facility], [facility] 08/08/2018, 6:10 PM

## 2018-08-08 NOTE — Discharge Instructions (Signed)

## 2018-08-08 NOTE — Discharge Summary (Addendum)
OB Discharge Summary     Patient Name: Deanna Vaughn DOB: 1991/07/26 MRN: [id number]  Date of admission: 08/08/2018 Delivering MD: Letitia Libra, MD  Date of Delivery: 08/08/2018  Date of discharge: 08/10/2018  Admitting diagnosis: REPEAT, STERILITY Intrauterine pregnancy: [redacted]w[redacted]d     Secondary diagnosis: None     Discharge diagnosis: Term Pregnancy Delivered, Reasons for cesarean section  Elective repeat                       Desire for sterilization  Hospital course:  Sceduled C/S   28 y.o. yo [id number] at [redacted]w[redacted]d was admitted to the hospital 08/08/2018 for scheduled cesarean section with the following indication:Elective Repeat.  Membrane Rupture Time/Date: 8:13 AM ,08/08/2018   Patient delivered a Viable infant.08/08/2018  Details of operation can be found in separate operative note.  Patient had an uncomplicated postpartum course.  She is ambulating, tolerating a regular diet, passing flatus, and urinating well. She prefers to have On Q pump taken out before she goes home. She is asymptomatic for anemia and is encouraged to take PO iron and eat iron rich foods. Patient is discharged home in stable condition on  08/10/18                                                                        Post partum procedures: tubal ligation  Complications: Hemorrhage>1064mL  Physical exam on 08/10/2018: Vitals:   08/09/18 1649 08/09/18 2338 08/10/18 0413 08/10/18 0818  BP: 123/86 123/76 111/88 116/82  Pulse: 86 97 87 87  Resp: 18 20 18 20   Temp: 98.9 F (37.2 C) 98.6 F (37 C) 98.4 F (36.9 C) 98 F (36.7 C)  TempSrc: Oral Oral Oral Oral  SpO2: 100% 100% 100% 99%  Weight:      Height:       General: alert, cooperative and no distress Lochia: appropriate Uterine Fundus: firm Incision: Healing well with no significant drainage, On Q with sanguinous drainage DVT Evaluation: No evidence of DVT seen on physical exam.  Labs: Lab Results  Component Value Date   WBC 11.1 (H) 08/10/2018    HGB 7.6 (L) 08/10/2018   HCT 23.6 (L) 08/10/2018   MCV 81.9 08/10/2018   PLT 160 08/10/2018   CMP Latest Ref Rng & Units 08/09/2018  Glucose 70 - 99 mg/dL 57(W)  BUN 6 - 20 mg/dL 9  Creatinine 6.20 - 3.55 mg/dL 9.74  Sodium 163 - 845 mmol/L 135  Potassium 3.5 - 5.1 mmol/L 3.5  Chloride 98 - 111 mmol/L 111  CO2 22 - 32 mmol/L 19(L)  Calcium 8.9 - 10.3 mg/dL 7.5(L)  Total Protein 6.4 - 8.2 g/dL -  Total Bilirubin 0.2 - 1.0 mg/dL -  Alkaline Phos 50 - 364 Unit/L -  AST 15 - 37 Unit/L -  ALT 12 - 78 U/L -    Discharge instruction: per After Visit Summary.  Medications:  Allergies as of 08/10/2018   No Known Allergies     Medication List    TAKE these medications   oxyCODONE 5 MG immediate release tablet Commonly known as:  Oxy IR/ROXICODONE Take 1 tablet (5 mg total) by mouth every 6 (six) hours as needed for up  to 5 days for moderate pain or severe pain.   prenatal multivitamin Tabs tablet Take 1 tablet by mouth daily at 12 noon.            Discharge Care Instructions  (From admission, onward)         Start     Ordered   08/10/18 0000  Discharge wound care:    Comments:  Keep incision dry, clean.   08/10/18 0942         D/C On Q pump prior to discharge Diet: routine diet  Activity: Advance as tolerated. Pelvic rest for 6 weeks.   Outpatient follow up: Follow-up Information    Nadara MustardHarris, Robert P, MD In 1 week.   Specialty:  Obstetrics and Gynecology Why:  For wound re-check Contact information: [address] [place] [address] [phone]             Postpartum contraception: Tubal Ligation Rhogam Given postpartum: NA Rubella vaccine given postpartum: Rubella Immune Varicella vaccine given postpartum: Varicella Immune TDaP given antepartum or postpartum: Yes  Newborn Data: Live born female Kiser Birth Weight: 8 lb 2.5 oz (3700 g) APGAR: 9, 9  Newborn Delivery   Birth date/time:  08/08/2018 08:14:00 Delivery type:  C-Section, Low  Transverse Trial of labor:  No C-section categorization:  Repeat      Baby Feeding: Formula  Disposition:home with mother  SIGNED:  Tresea MallJane Khaya Vaughn, CNM 08/10/2018 9:43 AM

## 2018-08-08 NOTE — Anesthesia Post-op Follow-up Note (Signed)
Anesthesia QCDR form completed.        

## 2018-08-08 NOTE — Op Note (Signed)
Cesarean Section Procedure Note Indications: prior cesarean section and term intrauterine pregnancy, Desire for permanent sterility  Pre-operative Diagnosis: Intrauterine pregnancy 2826w1d ;  prior cesarean section and term intrauterine pregnancy, Desire for permanent sterility Post-operative Diagnosis: same, delivered. Procedure: Low Transverse Cesarean Section, Bilateral Tubal Ligation Surgeon: Annamarie MajorPaul Audie Stayer, MD Assistant(s): Dr Bonney AidStaebler, No other capable assistant available, in surgery requiring high level assistant. Anesthesia: Spinal anesthesia Estimated Blood Loss:750 mL Complications: None; patient tolerated the procedure well. Disposition: PACU - hemodynamically stable. Condition: stable  Findings: A female infant in the cephalic presentation. Amniotic fluid - Clear  Birth weight 8-3 lbs.  Apgars of 8 and 9.  Intact placenta with a three-vessel cord. Grossly normal uterus, tubes and ovaries bilaterally. Severe anterior uterine and lower uterine segment intraabdominal adhesions were noted.  Procedure Details   The patient was taken to Operating Room, identified as the correct patient and the procedure verified as C-Section Delivery. A Time Out was held and the above information confirmed. After induction of anesthesia, the patient was draped and prepped in the usual sterile manner. A Pfannenstiel incision was made and carried down through the subcutaneous tissue to the fascia. Fascial incision was made and extended transversely with the Mayo scissors. The fascia was separated from the underlying rectus tissue superiorly and inferiorly. The peritoneum was identified and entered bluntly. Peritoneal incision was extended longitudinally. The utero-vesical peritoneal reflection was incised transversely and a bladder flap was created digitally.  A low transverse hysterotomy was made. The fetus was delivered atraumatically. The umbilical cord was clamped x2 and cut and the infant was handed to the  awaiting pediatricians. The placenta was removed intact and appeared normal with a 3-vessel cord.  The uterus was exteriorized and cleared of all clot and debris. The hysterotomy was closed with running sutures of 0 Vicryl suture. A second imbricating layer was placed with the same suture. Excellent hemostasis was observed. Additional suture closure of adhesion areas that were a part of the uterus was done.  Likelihood for future adhesion formation present along the anterior uterine wall.  Interceed placed over the incisions on uterus.  The left Fallopian tube was identified, grasped with the Babcock clamps, lifted to the skin incision and followed out distally to the fimbriae. An avascular midsection of the tube approximately 3-4cm from the cornua was grasped with the babcock clamps and brought into a knuckle at the skin incision. The tube was double ligated with 2-0 Vicryl suture and the intervening portion of tube was transected and removed. Excellent hemostasis was noted and the tube was returned to the abdomen. Attention was then turned to the right fallopian tube after confirmation of identification by tracing the tube out to the fimbriae. The same procedure was then performed on the right Fallopian tube. Again, excellent hemostasis was noted at the end of the procedure.  The uterus was returned to the abdomen. The pelvis was irrigated and again, excellent hemostasis was noted.  The On Q Pain pump System was then placed.  Trocars were placed through the abdominal wall into the subfascial space and these were used to thread the silver soaker cathaters into place.The rectus fascia was then reapproximated with running sutures of Maxon, with careful placement not to incorporate the cathaters. Subcutaneous tissues are then irrigated with saline and hemostasis assured.  Skin is then closed with 4-0 vicryl suture in a subcuticular fashion followed by skin adhesive. The cathaters are flushed each with 5 mL of  Bupivicaine and stabilized into place with dressing.  Instrument, sponge, and needle counts were correct prior to the abdominal closure and at the conclusion of the case.  The patient tolerated the procedure well and was transferred to the recovery room in stable condition.   Annamarie MajorPaul Equilla Que, MD, Merlinda FrederickFACOG [facility], [facility] 08/08/2018  9:16 AM

## 2018-08-08 NOTE — Interval H&P Note (Signed)
History and Physical Interval Note:  08/08/2018 7:19 AM  Deanna Vaughn  has presented today for surgery, with the diagnosis of REPEAT, STERILITY  The various methods of treatment have been discussed with the patient and family. After consideration of risks, benefits and other options for treatment, the patient has consented to  Procedure(s): CESAREAN SECTION WITH BILATERAL TUBAL LIGATION (N/A) as a surgical intervention .  The patient's history has been reviewed, patient examined, no change in status, stable for surgery.  I have reviewed the patient's chart and labs.  Questions were answered to the patient's satisfaction.     Letitia Libra

## 2018-08-09 ENCOUNTER — Encounter: Payer: Self-pay | Admitting: Obstetrics & Gynecology

## 2018-08-09 LAB — BASIC METABOLIC PANEL
Anion gap: 5 (ref 5–15)
BUN: 9 mg/dL (ref 6–20)
CO2: 19 mmol/L — ABNORMAL LOW (ref 22–32)
Calcium: 7.5 mg/dL — ABNORMAL LOW (ref 8.9–10.3)
Chloride: 111 mmol/L (ref 98–111)
Creatinine, Ser: 0.57 mg/dL (ref 0.44–1.00)
GFR calc Af Amer: >60 mL/min (ref >60)
GFR calc non Af Amer: >60 mL/min (ref >60)
Glucose, Bld: 64 mg/dL — ABNORMAL LOW (ref 70–99)
Potassium: 3.5 mmol/L (ref 3.5–5.1)
Sodium: 135 mmol/L (ref 135–145)

## 2018-08-09 LAB — CBC
HCT: 21.9 % — ABNORMAL LOW (ref 36.0–46.0)
Hemoglobin: 6.9 g/dL — ABNORMAL LOW (ref 12.0–15.0)
MCH: 26.4 pg (ref 26.0–34.0)
MCHC: 31.5 g/dL (ref 30.0–36.0)
MCV: 83.9 fL (ref 80.0–100.0)
Platelets: 137 10*3/uL — ABNORMAL LOW (ref 150–400)
RBC: 2.61 MIL/uL — AB (ref 3.87–5.11)
RDW: 13.9 % (ref 11.5–15.5)
WBC: 7.5 10*3/uL (ref 4.0–10.5)
nRBC: 0 % (ref 0.0–0.2)

## 2018-08-09 LAB — PREPARE RBC (CROSSMATCH)

## 2018-08-09 LAB — SURGICAL PATHOLOGY

## 2018-08-09 LAB — HEMOGLOBIN AND HEMATOCRIT, BLOOD
HCT: 25.9 % — ABNORMAL LOW (ref 36.0–46.0)
Hemoglobin: 8.1 g/dL — ABNORMAL LOW (ref 12.0–15.0)

## 2018-08-09 MED ORDER — ACETAMINOPHEN 325 MG PO TABS: 650.0000 mg | ORAL_TABLET | Freq: Four times a day (QID) | ORAL | Status: DC | PRN

## 2018-08-09 MED ORDER — SODIUM CHLORIDE 0.9% IV SOLUTION
Freq: Once | INTRAVENOUS | Status: AC
  Administered 2018-08-09: 13:00:00 via INTRAVENOUS

## 2018-08-09 MED ORDER — LACTATED RINGERS IV BOLUS
300.0000 mL | Freq: Once | INTRAVENOUS | Status: AC
  Administered 2018-08-09: 300 mL via INTRAVENOUS

## 2018-08-09 MED ORDER — IBUPROFEN 600 MG PO TABS
600.0000 mg | ORAL_TABLET | Freq: Four times a day (QID) | ORAL | Status: DC
  Administered 2018-08-09 – 2018-08-10 (×3): 600 mg via ORAL
  Filled 2018-08-09 (×3): qty 1

## 2018-08-09 MED ORDER — ACETAMINOPHEN 325 MG PO TABS
650.0000 mg | ORAL_TABLET | Freq: Once | ORAL | Status: AC
  Administered 2018-08-09: 650 mg via ORAL

## 2018-08-09 MED FILL — Oxytocin-Sodium Chloride 0.9% IV Soln 40 Unit/1000ML: INTRAVENOUS | Qty: 1000 | Status: AC

## 2018-08-09 NOTE — Anesthesia Postprocedure Evaluation (Signed)
Anesthesia Post Note  Patient: Deanna Vaughn  Procedure(s) Performed: CESAREAN SECTION WITH BILATERAL TUBAL LIGATION (N/A )  Patient location during evaluation: Mother Baby Anesthesia Type: Spinal Level of consciousness: awake and alert Pain management: pain level controlled Vital Signs Assessment: post-procedure vital signs reviewed and stable Respiratory status: spontaneous breathing Cardiovascular status: stable Postop Assessment: patient able to bend at knees, adequate PO intake and able to ambulate Anesthetic complications: no     Last Vitals:  Vitals:   08/09/18 0334 08/09/18 0500  BP: 120/70   Pulse: 88   Resp: 18   Temp: 36.7 C   SpO2: 100% 99%    Last Pain:  Vitals:   08/09/18 0334  TempSrc: Oral  PainSc:                  Zachary George

## 2018-08-09 NOTE — Lactation Note (Signed)
This note was copied from a baby's chart. Lactation Consultation Note  Patient Name: Deanna Vaughn     Maternal Data    Feeding    LATCH Score                   Interventions    Lactation Tools Discussed/Used     Consult Status  LC talked with mother about paced bottle feeding and how to avoid overfeeding. Mother also educated on engorgement and mastitis, signs and symptoms and what to do if she experiences any of these symptoms.    Deanna Vaughn Vaughn, 10:54 AM

## 2018-08-09 NOTE — Progress Notes (Signed)
Postpartum/ postoperative day 1 note  S: OOB sitting in chair, holding baby. Tolerating clear liquids  O: BP 115/71 (BP Location: Left Arm)   Pulse 83   Temp 98.4 F (36.9 C) (Oral)   Resp 18   Ht 5' (1.524 m)   Wt 97.5 kg   LMP 10/22/2017 (LMP Unknown)   SpO2 100%   Breastfeeding Unknown   BMI 41.99 kg/m    General: appears comfortable. Urine output from 0930 to 12 noon was 25ml, 56ml since 12 noon.Urine still appears concentrated.  BUN, creatinine WNL (9, 0.57) Blood transfusion not yet started  A: Borderline adequate urine output Normal BUN, cr Anemia, but hemodynamically stable.  P: Blood transfusion x 1 unit Continue I&O every 1 hour H&H 4 hours after transfusion Consider a third unit if urine output inadequate per Dr Jerene Pitch  Farrel Conners, CNM

## 2018-08-09 NOTE — Progress Notes (Addendum)
POD #1 Repeat CS and BTL. Received one unit of blood post operatively.  Subjective:   Rating pain a 0. Denies nausea this AM. Has been NPO during the night. Nausea and vomiting yesterday after surgery. No lightheadedness when sitting up in bed.  Baby doing well. Bottlefeeding  Objective:  Blood pressure 121/70, pulse 87, temperature 98.9 F (37.2 C), temperature source Oral, resp. rate 18, height 5' (1.524 m), weight 97.5 kg, last menstrual period 10/22/2017, SpO2 98 % Urine output over last 5 hours=4980ml Urine output until 0400 this AM=860 ml  with total in =386898ml.  General: BF in  NAD Pulmonary: no increased work of breathing/ CTAB Abdomen: non-distended, appropriately tender, fundus firm at level of umbilicus, bowel sounds hypoactive Incision: Honey comb dressing C+D+I. ON Q intact with small amount serosanguinous drainage on dressing.  Extremities: pedal edema; SCDs on  Results for orders placed or performed during the hospital encounter of 08/08/18 (from the past 72 hour(s))  Sample to Blood Bank     Status: None   Collection Time: 08/08/18  6:06 AM  Result Value Ref Range   Blood Bank Specimen SAMPLE AVAILABLE FOR TESTING    Sample Expiration      08/11/2018 Performed at [facility], [address] [place], [place] [address]   Surgical pathology     Status: None   Collection Time: 08/08/18  8:08 AM  Result Value Ref Range   SURGICAL PATHOLOGY      Surgical Pathology CASE: [id number] PATIENT: Deanna SickleKEONA Yano Surgical Pathology Report     SPECIMEN SUBMITTED: A.  Fallopian tube, right B. Fallopian tube, left  CLINICAL HISTORY: [id number] (see sticky note on orders)  PRE-OPERATIVE DIAGNOSIS: Repeat sterility  POST-OPERATIVE DIAGNOSIS: Same as pre op     DIAGNOSIS: A. FALLOPIAN TUBE, RIGHT; LIGATION: - COMPLETE CROSS-SECTIONS OF BENIGN FALLOPIAN TUBE IDENTIFIED  B. FALLOPIAN TUBE, LEFT; LIGATION: - COMPLETE CROSS-SECTIONS OF BENIGN FALLOPIAN TUBE  IDENTIFIED.   GROSS DESCRIPTION: A. Labeled: Right tube portion Received: Formalin Tissue fragment(s): 1 Size: 2.5 cm in length by 1.0 cm in diameter Description: Irregular, tubular shaped fragment of tan-pink soft tissue, grossly consistent with portion of fallopian tube.  Fimbria are absent. No abnormalities are grossly identified. Representative cross sections are submitted in cassette 1.  B. Labeled: Left tube portion Received: Formalin Tissue fr agment(s): 1 Size: 1.2 cm in length by 0.7 cm in diameter Description: Irregular, tubular shaped fragment of tan-pink soft tissue, grossly consistent with portion of fallopian tube.  Fimbria are absent. No abnormalities are grossly identified. Representative cross sections are submitted in cassette 1.    Final Diagnosis performed by Redmond PullingErnest Andree, MD.   Electronically signed 08/09/2018 8:40:27AM The electronic signature indicates that the named Attending Pathologist has evaluated the specimen  Technical component performed at [facility], [address], [place], [place] [address] Lab: [phone] Dir: Jolene SchimkeSanjai Nagendra, MD, MMM  Professional component performed at [facility], [facility], [address], [place], [place] [address] Lab: [phone] Dir: Georgiann Cockerara C. Rubinas, MD   Prepare RBC     Status: None   Collection Time: 08/08/18 11:00 AM  Result Value Ref Range   Order Confirmation      ORDER PROCESSED BY BLOOD BANK Performed at [facility], [address] [place], [place] [address]   CBC     Status: Abnormal   Collection Time: 08/08/18  1:49 PM  Result Value Ref Range   WBC 14.7 (H) 4.0 - 10.5 K/uL   RBC 2.92 (L)  3.87 - 5.11 MIL/uL   Hemoglobin 7.6 (L) 12.0 - 15.0 g/dL    Comment: REPEATED TO VERIFY   HCT 25.2 (L) 36.0 - 46.0 %   MCV 86.3 80.0 - 100.0 fL   MCH 26.0 26.0 - 34.0 pg   MCHC 30.2 30.0 - 36.0 g/dL   RDW 16.113.3 09.611.5 - 04.515.5 %   Platelets 173 150 - 400 K/uL   nRBC 0.0 0.0 - 0.2 %     Comment: Performed at [facility], [address] [place], [place] [address]  ABO/Rh     Status: None   Collection Time: 08/08/18  1:49 PM  Result Value Ref Range   ABO/RH(D)      B POS Performed at [facility], [address] [place], [place] [address]   CBC     Status: Abnormal   Collection Time: 08/08/18  9:59 PM  Result Value Ref Range   WBC 10.9 (H) 4.0 - 10.5 K/uL   RBC 3.30 (L) 3.87 - 5.11 MIL/uL   Hemoglobin 8.6 (L) 12.0 - 15.0 g/dL   HCT 82.927.4 (L) 56.236.0 - 13.046.0 %   MCV 83.0 80.0 - 100.0 fL   MCH 26.1 26.0 - 34.0 pg   MCHC 31.4 30.0 - 36.0 g/dL   RDW 86.513.7 78.411.5 - 69.615.5 %   Platelets 181 150 - 400 K/uL   nRBC 0.0 0.0 - 0.2 %    Comment: Performed at [facility], [address] [place], [place] [address]  CBC     Status: Abnormal   Collection Time: 08/09/18  6:05 AM  Result Value Ref Range   WBC 7.5 4.0 - 10.5 K/uL   RBC 2.61 (L) 3.87 - 5.11 MIL/uL   Hemoglobin 6.9 (L) 12.0 - 15.0 g/dL   HCT 41.321.9 (L) 24.436.0 - 01.046.0 %   MCV 83.9 80.0 - 100.0 fL   MCH 26.4 26.0 - 34.0 pg   MCHC 31.5 30.0 - 36.0 g/dL   RDW 27.213.9 53.611.5 - 64.415.5 %   Platelets 137 (L) 150 - 400 K/uL   nRBC 0.0 0.0 - 0.2 %    Comment: Performed at [facility], [address] [place], [place] [address]  Prepare RBC     Status: None (Preliminary result)   Collection Time: 08/09/18 10:00 AM  Result Value Ref Range   Order Confirmation PENDING      Assessment:   28 y.o. G4P4003 postoperativeday # 1 Decreased urine output/ stable vital signs  BMP this AM  300 ml bolus of LR  Transfuse 1 more unit of blood     Plan:  1) Acute blood loss anemia - hemoglobin increased initially after one unit of blood last night, but this AM hmg was 6.9gm/dl. Although hemodynamically stable and asymptomatic, patient has not been OOB yet and this hemoglobin leaves her with little reserve. .  Transfuse another unit of blood this AM. - po ferrous sulfate when tolerating regular diet.  2)  B POS/RI/ VI  3) TDAP UTD  4) Bottle/BTL  5) OOB this afternoon after the transfusion. Keep foley until urine output increases and she is not lightheaded when OOB.  Deanna Connersolleen Melessa Cowell, CNM

## 2018-08-10 LAB — TYPE AND SCREEN
ABO/RH(D): B POS
Antibody Screen: NEGATIVE
Extend sample reason: UNDETERMINED
UNIT DIVISION: 0
Unit division: 0
Unit division: 0
Unit division: 0

## 2018-08-10 LAB — CBC
HCT: 23.6 % — ABNORMAL LOW (ref 36.0–46.0)
Hemoglobin: 7.6 g/dL — ABNORMAL LOW (ref 12.0–15.0)
MCH: 26.4 pg (ref 26.0–34.0)
MCHC: 32.2 g/dL (ref 30.0–36.0)
MCV: 81.9 fL (ref 80.0–100.0)
Platelets: 160 10*3/uL (ref 150–400)
RBC: 2.88 MIL/uL — ABNORMAL LOW (ref 3.87–5.11)
RDW: 14.1 % (ref 11.5–15.5)
WBC: 11.1 10*3/uL — ABNORMAL HIGH (ref 4.0–10.5)
nRBC: 0 % (ref 0.0–0.2)

## 2018-08-10 LAB — BPAM RBC
Blood Product Expiration Date: 202002012359
Blood Product Expiration Date: 202002012359
Blood Product Expiration Date: 202002022359
Blood Product Expiration Date: 202002032359
ISSUE DATE / TIME: 202001091541
ISSUE DATE / TIME: 202001101315
UNIT TYPE AND RH: 7300
Unit Type and Rh: 7300
Unit Type and Rh: 7300
Unit Type and Rh: 7300

## 2018-08-10 MED ORDER — OXYCODONE HCL 5 MG PO TABS: 5.0000 mg | ORAL_TABLET | Freq: Four times a day (QID) | ORAL | 0 refills | Status: AC | PRN

## 2018-08-10 MED ORDER — PRENATAL MULTIVITAMIN CH: 1.0000 | ORAL_TABLET | Freq: Every day | ORAL | 4 refills | Status: DC

## 2018-08-10 NOTE — Progress Notes (Signed)
Discharge instructions complete and prescriptions given. Patient verbalizes understanding of teaching. Patient discharged home via wheelchair at 1145.

## 2018-08-15 ENCOUNTER — Ambulatory Visit (INDEPENDENT_AMBULATORY_CARE_PROVIDER_SITE_OTHER): Payer: BLUE CROSS/BLUE SHIELD | Admitting: Obstetrics & Gynecology

## 2018-08-15 ENCOUNTER — Encounter: Payer: Self-pay | Admitting: Obstetrics & Gynecology

## 2018-08-15 VITALS — BP 120/80 | Ht 60.0 in | Wt 208.0 lb

## 2018-08-15 DIAGNOSIS — Z98891 History of uterine scar from previous surgery: Secondary | ICD-10-CM

## 2018-08-15 NOTE — Progress Notes (Signed)
  Postoperative Follow-up Patient presents post op from CS BTL for term preg, repeat, and sterility, 1 week ago.  Subjective: Patient reports marked improvement in her preop symptoms. Eating a regular diet without difficulty. Pain is controlled without any medications.  Activity: sedentary. Patient reports additional symptom's since surgery of None.  Objective: BP 120/80   Ht 5' (1.524 m)   Wt 208 lb (94.3 kg)   BMI 40.62 kg/m  Physical Exam Constitutional:      General: She is not in acute distress.    Appearance: She is well-developed.  Cardiovascular:     Rate and Rhythm: Normal rate.  Pulmonary:     Effort: Pulmonary effort is normal.  Abdominal:     General: There is no distension.     Palpations: Abdomen is soft.     Tenderness: There is no abdominal tenderness.     Comments: Incision Healing Well   Musculoskeletal: Normal range of motion.  Neurological:     Mental Status: She is alert and oriented to person, place, and time.     Cranial Nerves: No cranial nerve deficit.  Skin:    General: Skin is warm and dry.     Assessment: s/p :  cesarean section and tubal ligation progressing well  Plan: Patient has done well after surgery with no apparent complications.  I have discussed the post-operative course to date, and the expected progress moving forward.  The patient understands what complications to be concerned about.  I will see the patient in routine follow up, or sooner if needed.    Activity plan: No heavy lifting.Marland Kitchen  Pelvic rest.  Letitia Libra 08/15/2018, 3:19 PM

## 2018-08-17 LAB — ABO/RH: ABO/RH(D): B POS

## 2018-09-03 DIAGNOSIS — Z029 Encounter for administrative examinations, unspecified: Secondary | ICD-10-CM

## 2018-09-18 ENCOUNTER — Ambulatory Visit (INDEPENDENT_AMBULATORY_CARE_PROVIDER_SITE_OTHER): Payer: BLUE CROSS/BLUE SHIELD | Admitting: Obstetrics & Gynecology

## 2018-09-18 ENCOUNTER — Encounter: Payer: Self-pay | Admitting: Obstetrics & Gynecology

## 2018-09-18 VITALS — BP 100/60 | Ht 60.0 in | Wt 183.0 lb

## 2018-09-18 DIAGNOSIS — Z1389 Encounter for screening for other disorder: Secondary | ICD-10-CM | POA: Diagnosis not present

## 2018-09-18 DIAGNOSIS — Z98891 History of uterine scar from previous surgery: Secondary | ICD-10-CM

## 2018-09-18 NOTE — Progress Notes (Signed)
  OBSTETRICS POSTPARTUM CLINIC PROGRESS NOTE  Subjective:     Deanna Vaughn is a 28 y.o. [id number] female who presents for a postpartum visit. She is 6 weeks postpartum following a Term pregnancy and delivery by C-section repeat; no problems after deliver.  I have fully reviewed the prenatal and intrapartum course. Anesthesia: spinal.  Postpartum course has been complicated by uncomplicated.  Baby is feeding by Bottle.  Bleeding: patient has  resumed menses.  Bowel function is normal. Bladder function is normal.  Patient is not sexually active. Contraception method desired is tubal ligation.  Postpartum depression screening: negative. Edinburgh 0.  The following portions of the patient's history were reviewed and updated as appropriate: allergies, current medications, past family history, past medical history, past social history, past surgical history and problem list.  Review of Systems Pertinent items are noted in HPI.  Objective:    BP 100/60   Ht 5' (1.524 m)   Wt 183 lb (83 kg)   LMP 09/14/2018   BMI 35.74 kg/m   General:  alert and no distress   Breasts:  inspection negative, no nipple discharge or bleeding, no masses or nodularity palpable  Lungs: clear to auscultation bilaterally  Heart:  regular rate and rhythm, S1, S2 normal, no murmur, click, rub or gallop  Abdomen: soft, non-tender; bowel sounds normal; no masses,  no organomegaly.  Well healed Pfannenstiel incision  Pelvic andRectal Exam: Not performed (bleeding heavy today)          Assessment:  Post Partum Care visit 1. S/P cesarean section  Plan:  See orders and Patient Instructions Resume all normal activities Follow up in: 4 months or as needed.     PAP then  Annamarie Major, MD, [facility], [facility] 09/18/2018  11:40 AM

## 2018-09-26 ENCOUNTER — Encounter: Payer: Self-pay | Admitting: Obstetrics & Gynecology

## 2018-09-26 NOTE — Telephone Encounter (Signed)
Letter done for work for her to pick up

## 2019-10-01 ENCOUNTER — Ambulatory Visit (INDEPENDENT_AMBULATORY_CARE_PROVIDER_SITE_OTHER): Payer: BC Managed Care – PPO | Admitting: Obstetrics & Gynecology

## 2019-10-01 ENCOUNTER — Other Ambulatory Visit (HOSPITAL_COMMUNITY)
Admission: RE | Admit: 2019-10-01 | Discharge: 2019-10-01 | Disposition: A | Discharge status: Home / Self Care | Payer: BC Managed Care – PPO | Source: Ambulatory Visit | Attending: Obstetrics & Gynecology | Admitting: Obstetrics & Gynecology

## 2019-10-01 ENCOUNTER — Other Ambulatory Visit: Payer: Self-pay

## 2019-10-01 ENCOUNTER — Encounter: Payer: Self-pay | Admitting: Obstetrics & Gynecology

## 2019-10-01 VITALS — BP 120/80 | Ht 60.0 in | Wt 194.0 lb

## 2019-10-01 DIAGNOSIS — Z124 Encounter for screening for malignant neoplasm of cervix: Secondary | ICD-10-CM

## 2019-10-01 DIAGNOSIS — Z01411 Encounter for gynecological examination (general) (routine) with abnormal findings: Secondary | ICD-10-CM

## 2019-10-01 DIAGNOSIS — Z01419 Encounter for gynecological examination (general) (routine) without abnormal findings: Secondary | ICD-10-CM

## 2019-10-01 DIAGNOSIS — R102 Pelvic and perineal pain: Secondary | ICD-10-CM | POA: Diagnosis not present

## 2019-10-01 NOTE — Patient Instructions (Signed)
Transvaginal Ultrasound A transvaginal ultrasound, also called an endovaginal ultrasound, is a test that uses sound waves to take pictures of the female genital tract. The pictures are taken with a device, called a transducer, that is placed in the vagina. This test may be done to:  Check for problems with your pregnancy.  Check your developing baby.  Check for anything abnormal in the uterus or ovaries.  Find out why you have pelvic pain or bleeding. Tell a health care provider about:  Any allergies you have.  All medicines you are taking, including vitamins, herbs, eye drops, creams, and over-the-counter medicines.  Any blood disorders you have.  Any surgeries you have had.  Any medical conditions you have.  Whether you are pregnant or may be pregnant.  Whether you are having your menstrual period. What are the risks? This is a safe procedure. There are no known risks or complications of having this test. What happens before the procedure? This procedure needs to be done when your bladder is empty. Follow your health care provider's instructions about drinking fluids and emptying your bladder before the test. What happens during the procedure?   You will empty your bladder before the procedure.  You will undress from the waist down.  You will lie down on an exam table, with your knees bent and your feet in foot holders.  A health care provider will cover the transducer with a sterile cover.  A gel will be put on the transducer. The gel helps transmit the sound waves and prevents irritation of your vagina.  The technician will insert the transducer into your vagina to get images. These will be displayed on a monitor that looks like a small television screen.  The transducer will be removed when the procedure is complete. The procedure may vary among health care providers and hospitals. What happens after the procedure?  It is up to you to get the results of your  procedure. Ask your health care provider, or the department that is doing the procedure, when your results will be ready.  Keep all follow-up visits as told by your health care provider. This is important. Summary  A transvaginal ultrasound, also called an endovaginal ultrasound, is a test that uses sound waves to take pictures of the female genital tract.  This is a safe procedure. There are no known risks associated with this test.  The procedure needs to be done when your bladder is empty. Follow your health care provider's instructions about drinking fluids and emptying your bladder before the test.  During the procedure, you will undress from the waist down and lie down on an exam table. A technician will insert a transducer into your vagina to obtain images.  Ask your health care provider, or the department that is doing the procedure, when your results will be ready. This information is not intended to replace advice given to you by your health care provider. Make sure you discuss any questions you have with your health care provider. Document Revised: 02/27/2018 Document Reviewed: 02/27/2018 Elsevier Patient Education  2020 Elsevier Inc.  

## 2019-10-01 NOTE — Progress Notes (Signed)
HPI:      Deanna Vaughn is a 29 y.o. [phone] who LMP was Patient's last menstrual period was 09/22/2019., she presents today for her annual examination. The patient has no complaints today other than lower pelvic pains, worse w irregular cycles, s/p BTL 2019 at time of CS and has not been on hormones (prior use w reg cycles). The patient is sexually active. Her last pap: approximate date 2019 and was normal. The patient does perform self breast exams.  There is no notable family history of breast or ovarian cancer in her family.  The patient has regular exercise: yes.  The patient denies current symptoms of depression.    GYN History: Contraception: tubal ligation  PMHx: Past Medical History:  Diagnosis Date  . Anemia    Past Surgical History:  Procedure Laterality Date  . CESAREAN SECTION  2009  . CESAREAN SECTION WITH BILATERAL TUBAL LIGATION N/A 11/18/2015   Procedure: CESAREAN SECTION ;  Surgeon: Nadara Mustard, MD;  Location: [facility] ORS;  Service: Obstetrics;  Laterality: N/A;  . CESAREAN SECTION WITH BILATERAL TUBAL LIGATION N/A 08/08/2018   Procedure: CESAREAN SECTION WITH BILATERAL TUBAL LIGATION;  Surgeon: Nadara Mustard, MD;  Location: [facility] ORS;  Service: Obstetrics;  Laterality: N/A;   Family History  Problem Relation Age of Onset  . Hypertension Mother   . Diabetes Mother    Social History   Tobacco Use  . Smoking status: Never Smoker  . Smokeless tobacco: Never Used  Substance Use Topics  . Alcohol use: No  . Drug use: No   No current outpatient medications on file. Allergies: Patient has no known allergies.  Review of Systems  Constitutional: Negative for chills, fever and malaise/fatigue.  HENT: Negative for congestion, sinus pain and sore throat.   Eyes: Negative for blurred vision and pain.  Respiratory: Negative for cough and wheezing.   Cardiovascular: Negative for chest pain and leg swelling.  Gastrointestinal: Positive for abdominal pain.  Negative for constipation, diarrhea, heartburn, nausea and vomiting.  Genitourinary: Negative for dysuria, frequency, hematuria and urgency.  Musculoskeletal: Negative for back pain, joint pain, myalgias and neck pain.  Skin: Negative for itching and rash.  Neurological: Negative for dizziness, tremors and weakness.  Endo/Heme/Allergies: Does not bruise/bleed easily.  Psychiatric/Behavioral: Negative for depression. The patient is not nervous/anxious and does not have insomnia.     Objective: BP 120/80   Ht 5' (1.524 m)   Wt 194 lb (88 kg)   LMP 09/22/2019   BMI 37.89 kg/m   Filed Weights   10/01/19 1432  Weight: 194 lb (88 kg)   Body mass index is 37.89 kg/m. Physical Exam Constitutional:      General: She is not in acute distress.    Appearance: She is well-developed.  Genitourinary:     Pelvic exam was performed with patient supine.     Urethra, bladder, vagina and rectum normal.     No lesions in the vagina.     No vaginal bleeding.     No cervical motion tenderness, friability, lesion or polyp.     Uterus is tender and mobile.     Uterus is not enlarged.     No uterine mass detected.    Uterus is midaxial.     No right or left adnexal mass present.     Right adnexa not tender.     Left adnexa not tender.  HENT:     Head: Normocephalic and atraumatic. No laceration.  Right Ear: Hearing normal.     Left Ear: Hearing normal.     Mouth/Throat:     Pharynx: Uvula midline.  Eyes:     Pupils: Pupils are equal, round, and reactive to light.  Neck:     Thyroid: No thyromegaly.  Cardiovascular:     Rate and Rhythm: Normal rate and regular rhythm.     Heart sounds: No murmur. No friction rub. No gallop.   Pulmonary:     Effort: Pulmonary effort is normal. No respiratory distress.     Breath sounds: Normal breath sounds. No wheezing.  Chest:     Breasts:        Right: No mass, skin change or tenderness.        Left: No mass, skin change or tenderness.  Abdominal:      General: Bowel sounds are normal. There is no distension.     Palpations: Abdomen is soft.     Tenderness: There is no abdominal tenderness. There is no rebound.  Musculoskeletal:        General: Normal range of motion.     Cervical back: Normal range of motion and neck supple.  Neurological:     Mental Status: She is alert and oriented to person, place, and time.     Cranial Nerves: No cranial nerve deficit.  Skin:    General: Skin is warm and dry.  Psychiatric:        Judgment: Judgment normal.  Vitals reviewed.     Assessment:  ANNUAL EXAM 1. Women's annual routine gynecological examination   2. Screening for cervical cancer   3. Pelvic pain      Screening Plan:            1.  Cervical Screening-  Pap smear done today  2. Breast screening- Exam annually and mammogram>40 planned   3. Labs managed by PCP  4. Counseling for contraception: bilateral tubal ligation   5. Pelvic pain - Pain due to period irregularity, vs fibroids, adhesions, other - US PELVIC COMPLETE WITH TRANSVAGINAL; Future - Consider hormonal therapy, surgery options    F/U  Return in about 1 week (around 10/08/2019) for Follow up w GYN [place].  Barnett Applebaum, MD, [facility], [facility] 10/01/2019  2:59 PM

## 2019-10-03 LAB — CYTOLOGY - PAP
Comment: NEGATIVE
Diagnosis: NEGATIVE
High risk HPV: NEGATIVE

## 2019-10-09 ENCOUNTER — Other Ambulatory Visit: Payer: Self-pay

## 2019-10-09 ENCOUNTER — Encounter: Payer: Self-pay | Admitting: Obstetrics & Gynecology

## 2019-10-09 ENCOUNTER — Ambulatory Visit (INDEPENDENT_AMBULATORY_CARE_PROVIDER_SITE_OTHER): Payer: BC Managed Care – PPO | Admitting: Obstetrics & Gynecology

## 2019-10-09 ENCOUNTER — Other Ambulatory Visit: Payer: Self-pay | Admitting: Obstetrics & Gynecology

## 2019-10-09 ENCOUNTER — Ambulatory Visit (INDEPENDENT_AMBULATORY_CARE_PROVIDER_SITE_OTHER): Payer: BC Managed Care – PPO

## 2019-10-09 VITALS — BP 100/60 | Ht 60.0 in | Wt 193.0 lb

## 2019-10-09 DIAGNOSIS — R102 Pelvic and perineal pain: Secondary | ICD-10-CM | POA: Diagnosis not present

## 2019-10-09 DIAGNOSIS — N925 Other specified irregular menstruation: Secondary | ICD-10-CM | POA: Diagnosis not present

## 2019-10-09 DIAGNOSIS — N926 Irregular menstruation, unspecified: Secondary | ICD-10-CM

## 2019-10-09 MED ORDER — ETONOGESTREL-ETHINYL ESTRADIOL 0.12-0.015 MG/24HR VA RING: VAGINAL_RING | VAGINAL | 3 refills | Status: DC

## 2019-10-09 NOTE — Progress Notes (Signed)
  HPI: Pt has been having abnormal periods that have been irregular, but also pelvi cpain that is random and moderate at times.  Prior CS BTL, no recent hormones.  Ultrasound demonstrates no masses seen These findings are Pelvis normal  PMHx: She  has a past medical history of Anemia. Also,  has a past surgical history that includes Cesarean section (2009); Cesarean section with bilateral tubal ligation (N/A, 11/18/2015); and Cesarean section with bilateral tubal ligation (N/A, 08/08/2018)., family history includes Diabetes in her mother; Hypertension in her mother.,  reports that she has never smoked. She has never used smokeless tobacco. She reports that she does not drink alcohol or use drugs.  She has a current medication list which includes the following prescription(s): etonogestrel-ethinyl estradiol. Also, has No Known Allergies.  Review of Systems  All other systems reviewed and are negative.   Objective: BP 100/60   Ht 5' (1.524 m)   Wt 193 lb (87.5 kg)   LMP 09/22/2019   BMI 37.69 kg/m   Physical examination Constitutional NAD, Conversant  Skin No rashes, lesions or ulceration.   Extremities: Moves all appropriately.  Normal ROM for age. No lymphadenopathy.  Neuro: Grossly intact  Psych: Oriented to PPT.  Normal mood. Normal affect.   US PELVIS TRANSVAGINAL NON-OB (TV ONLY)  Result Date: 10/09/2019 Patient Name: Deanna Vaughn DOB: Jul 10, 1991 MRN: [id number] ULTRASOUND REPORT Location: [facility] Date of Service: 10/09/2019 Indications:Pelvic Pain Findings: The uterus is retroflexed and measures 10.6 x 4.9 x 4.3 cm. Echo texture is homogenous without evidence of focal masses. The Endometrium measures 9.9 mm. Right Ovary measures 3.7 x 2.5 x 2.6 cm. It is normal in appearance. There is a dominant follicle in the right ovary measuring 21.4 x 18.3 x 23.3 mm. Left Ovary measures 2.6 x 1.7 x 1.8 cm. It is normal in appearance. Survey of the adnexa demonstrates no adnexal masses.  There is no free fluid in the cul de sac. Impression: 1. Normal pelvic ultrasound. Recommendations: 1.Clinical correlation with the patient's History and Physical Exam. Deanna Artis, RT Review of ULTRASOUND.    I have personally reviewed images and report of recent ultrasound done at [facility].    Plan of management to be discussed with patient. Annamarie Major, MD, FACOG [facility], [facility] 10/09/2019  4:29 PM   Assessment:  Pelvic pain  Irregular menses  Discussed options such as hormonal therapy, ablation, hysterectomy.  Will start w Nuvaring (she has used in past w success) to improve bleeding and see if pain improves as well.  Give it at least 3 mos.  A total of 20 minutes were spent face-to-face with the patient as well as preparation, review, communication, and documentation during this encounter.   Annamarie Major, MD, [facility], [facility] 10/09/2019  4:38 PM

## 2020-07-31 HISTORY — PX: CHOLECYSTECTOMY: SHX55

## 2020-12-08 ENCOUNTER — Emergency Department: Payer: BC Managed Care – PPO

## 2020-12-08 ENCOUNTER — Emergency Department
Admission: EM | Admit: 2020-12-08 | Discharge: 2020-12-09 | Disposition: A | Discharge status: Home / Self Care | Payer: BC Managed Care – PPO | Attending: Emergency Medicine | Admitting: Emergency Medicine

## 2020-12-08 ENCOUNTER — Other Ambulatory Visit: Payer: Self-pay

## 2020-12-08 DIAGNOSIS — K8050 Calculus of bile duct without cholangitis or cholecystitis without obstruction: Secondary | ICD-10-CM | POA: Diagnosis not present

## 2020-12-08 DIAGNOSIS — R112 Nausea with vomiting, unspecified: Secondary | ICD-10-CM | POA: Insufficient documentation

## 2020-12-08 DIAGNOSIS — K8020 Calculus of gallbladder without cholecystitis without obstruction: Secondary | ICD-10-CM | POA: Diagnosis not present

## 2020-12-08 DIAGNOSIS — R1013 Epigastric pain: Secondary | ICD-10-CM | POA: Diagnosis not present

## 2020-12-08 DIAGNOSIS — R1011 Right upper quadrant pain: Secondary | ICD-10-CM | POA: Diagnosis not present

## 2020-12-08 DIAGNOSIS — K7689 Other specified diseases of liver: Secondary | ICD-10-CM | POA: Diagnosis not present

## 2020-12-08 DIAGNOSIS — K8070 Calculus of gallbladder and bile duct without cholecystitis without obstruction: Secondary | ICD-10-CM | POA: Diagnosis not present

## 2020-12-08 LAB — CBC WITH DIFFERENTIAL/PLATELET
Abs Immature Granulocytes: 0.05 10*3/uL (ref 0.00–0.07)
Basophils Absolute: 0.1 10*3/uL (ref 0.0–0.1)
Basophils Relative: 0 %
Eosinophils Absolute: 0.2 10*3/uL (ref 0.0–0.5)
Eosinophils Relative: 2 %
HCT: 35.5 % — ABNORMAL LOW (ref 36.0–46.0)
Hemoglobin: 11.3 g/dL — ABNORMAL LOW (ref 12.0–15.0)
Immature Granulocytes: 0 %
Lymphocytes Relative: 13 %
Lymphs Abs: 1.6 10*3/uL (ref 0.7–4.0)
MCH: 27.6 pg (ref 26.0–34.0)
MCHC: 31.8 g/dL (ref 30.0–36.0)
MCV: 86.6 fL (ref 80.0–100.0)
Monocytes Absolute: 0.7 10*3/uL (ref 0.1–1.0)
Monocytes Relative: 6 %
Neutro Abs: 10.1 10*3/uL — ABNORMAL HIGH (ref 1.7–7.7)
Neutrophils Relative %: 79 %
Platelets: 248 10*3/uL (ref 150–400)
RBC: 4.1 MIL/uL (ref 3.87–5.11)
RDW: 13.2 % (ref 11.5–15.5)
WBC: 12.7 10*3/uL — ABNORMAL HIGH (ref 4.0–10.5)
nRBC: 0 % (ref 0.0–0.2)

## 2020-12-08 LAB — POC URINE PREG, ED: Preg Test, Ur: NEGATIVE

## 2020-12-08 MED ORDER — MORPHINE SULFATE (PF) 4 MG/ML IV SOLN
4.0000 mg | Freq: Once | INTRAVENOUS | Status: AC
  Administered 2020-12-08: 4 mg via INTRAVENOUS
  Filled 2020-12-08: qty 1

## 2020-12-08 MED ORDER — ONDANSETRON 4 MG PO TBDP: 4.0000 mg | ORAL_TABLET | Freq: Three times a day (TID) | ORAL | 0 refills | Status: DC | PRN

## 2020-12-08 MED ORDER — OXYCODONE-ACETAMINOPHEN 5-325 MG PO TABS: 1.0000 | ORAL_TABLET | ORAL | 0 refills | Status: DC | PRN

## 2020-12-08 MED ORDER — ONDANSETRON HCL 4 MG/2ML IJ SOLN
4.0000 mg | Freq: Once | INTRAMUSCULAR | Status: AC
  Administered 2020-12-08: 4 mg via INTRAVENOUS
  Filled 2020-12-08: qty 2

## 2020-12-08 NOTE — ED Triage Notes (Signed)
Pt in with co upper abd pain x 1 week, has had some vomiting.

## 2020-12-08 NOTE — ED Provider Notes (Signed)
[facility] Emergency Department Provider Note   ____________________________________________   Event Date/Time   First MD Initiated Contact with Patient 12/08/20 2149     (approximate)  I have reviewed the triage vital signs and the nursing notes.   HISTORY  Chief Complaint Abdominal Pain    HPI Deanna Vaughn is a 30 y.o. female with past medical history of anemia who presents to the ED complaining of abdominal pain.  Patient reports that she has been dealing with intermittent pain in her epigastrium and right upper quadrant of her abdomen for about the past week.  She first had an episode while she was at work on Sunday and then had another episode today.  She describes the pain as sharp and severe, not exacerbated or alleviated by anything.  It came on today while she was sitting watching TV, she denies any association with eating or drinking.  She has had some nausea with 1 episode of vomiting, denies any changes in her bowel movements.  She has not had any fevers, flank pain, or dysuria.  She denies any abdominal surgical history.        Past Medical History:  Diagnosis Date  . Anemia     Patient Active Problem List   Diagnosis Date Noted  . History of cesarean delivery 11/18/2015  . S/P cesarean section 11/18/2015  . Family history of anencephaly 05/06/2015    Past Surgical History:  Procedure Laterality Date  . CESAREAN SECTION  2009  . CESAREAN SECTION WITH BILATERAL TUBAL LIGATION N/A 11/18/2015   Procedure: CESAREAN SECTION ;  Surgeon: Nadara Mustard, MD;  Location: [facility] ORS;  Service: Obstetrics;  Laterality: N/A;  . CESAREAN SECTION WITH BILATERAL TUBAL LIGATION N/A 08/08/2018   Procedure: CESAREAN SECTION WITH BILATERAL TUBAL LIGATION;  Surgeon: Nadara Mustard, MD;  Location: [facility] ORS;  Service: Obstetrics;  Laterality: N/A;    Prior to Admission medications   Medication Sig Start Date End Date Taking? Authorizing Provider   etonogestrel-ethinyl estradiol (NUVARING) 0.12-0.015 MG/24HR vaginal ring Insert vaginally and leave in place for 3 consecutive weeks, then remove for 1 week. 10/09/19   Nadara Mustard, MD    Allergies Patient has no known allergies.  Family History  Problem Relation Age of Onset  . Hypertension Mother   . Diabetes Mother     Social History Social History   Tobacco Use  . Smoking status: Never Smoker  . Smokeless tobacco: Never Used  Vaping Use  . Vaping Use: Never used  Substance Use Topics  . Alcohol use: No  . Drug use: No    Review of Systems  Constitutional: No fever/chills Eyes: No visual changes. ENT: No sore throat. Cardiovascular: Denies chest pain. Respiratory: Denies shortness of breath. Gastrointestinal: Positive for abdominal pain, nausea, and vomiting.  No diarrhea.  No constipation. Genitourinary: Negative for dysuria. Musculoskeletal: Negative for back pain. Skin: Negative for rash. Neurological: Negative for headaches, focal weakness or numbness.  ____________________________________________   PHYSICAL EXAM:  VITAL SIGNS: ED Triage Vitals  Enc Vitals Group     BP 12/08/20 2147 111/81     Pulse Rate 12/08/20 2147 92     Resp 12/08/20 2147 20     Temp 12/08/20 2147 97.9 F (36.6 C)     Temp Source 12/08/20 2147 Oral     SpO2 12/08/20 2147 98 %     Weight 12/08/20 2146 185 lb (83.9 kg)     Height 12/08/20 2146 5' (1.524  m)     Head Circumference --      Peak Flow --      Pain Score 12/08/20 2146 9     Pain Loc --      Pain Edu? --      Excl. in GC? --     Constitutional: Alert and oriented. Eyes: Conjunctivae are normal. Head: Atraumatic. Nose: No congestion/rhinnorhea. Mouth/Throat: Mucous membranes are moist. Neck: Normal ROM Cardiovascular: Normal rate, regular rhythm. Grossly normal heart sounds. Respiratory: Normal respiratory effort.  No retractions. Lungs CTAB. Gastrointestinal: Soft and tender to palpation in the  epigastrium and right upper quadrant with no rebound or guarding. No distention. Genitourinary: deferred Musculoskeletal: No lower extremity tenderness nor edema. Neurologic:  Normal speech and language. No gross focal neurologic deficits are appreciated. Skin:  Skin is warm, dry and intact. No rash noted. Psychiatric: Mood and affect are normal. Speech and behavior are normal.  ____________________________________________   LABS (all labs ordered are listed, but only abnormal results are displayed)  Labs Reviewed  CBC WITH DIFFERENTIAL/PLATELET - Abnormal; Notable for the following components:      Result Value   WBC 12.7 (*)    Hemoglobin 11.3 (*)    HCT 35.5 (*)    Neutro Abs 10.1 (*)    All other components within normal limits  URINALYSIS, COMPLETE (UACMP) WITH MICROSCOPIC  COMPREHENSIVE METABOLIC PANEL  LIPASE, BLOOD  POC URINE PREG, ED     PROCEDURES  Procedure(s) performed (including Critical Care):  Procedures   ____________________________________________   INITIAL IMPRESSION / ASSESSMENT AND PLAN / ED COURSE       30 year old female with past medical history of anemia who presents to the ED complaining of intermittent right upper quadrant and epigastric pain over the past week.  She has some tenderness in her epigastrium and right upper quadrant and we will further assess with ultrasound, LFTs and lipase are pending.  Given pain reproducible with palpation of her abdomen, I doubt cardiac pathology.  Pregnancy testing and UA are pending.  We will treat symptomatically with IV morphine and Zofran.  Differential includes pancreatitis, hepatitis, biliary colic, cholecystitis, choledocholithiasis.  Patient turned over to oncoming provider pending labs and ultrasound results.      ____________________________________________   FINAL CLINICAL IMPRESSION(S) / ED DIAGNOSES  Final diagnoses:  RUQ pain     ED Discharge Orders    None       Note:  This  document was prepared using Dragon voice recognition software and may include unintentional dictation errors.   Chesley Noon, MD 12/08/20 [phone]

## 2020-12-08 NOTE — ED Notes (Signed)
Difficulty obtaining PIV, one failed attempt per this RN, other failed attempt per another RN.

## 2020-12-08 NOTE — ED Notes (Signed)
Patient transported to Ultrasound 

## 2020-12-09 LAB — URINALYSIS, COMPLETE (UACMP) WITH MICROSCOPIC
Bacteria, UA: NONE SEEN
Bilirubin Urine: NEGATIVE
Glucose, UA: NEGATIVE mg/dL
Hgb urine dipstick: NEGATIVE
Ketones, ur: NEGATIVE mg/dL
Leukocytes,Ua: NEGATIVE
Nitrite: NEGATIVE
Protein, ur: NEGATIVE mg/dL
Specific Gravity, Urine: 1.03 (ref 1.005–1.030)
pH: 5 (ref 5.0–8.0)

## 2020-12-09 LAB — LIPASE, BLOOD: Lipase: 26 U/L (ref 11–51)

## 2020-12-09 LAB — COMPREHENSIVE METABOLIC PANEL
ALT: 33 U/L (ref 0–44)
AST: 73 U/L — ABNORMAL HIGH (ref 15–41)
Albumin: 4.1 g/dL (ref 3.5–5.0)
Alkaline Phosphatase: 32 U/L — ABNORMAL LOW (ref 38–126)
Anion gap: 10 (ref 5–15)
BUN: 12 mg/dL (ref 6–20)
CO2: 24 mmol/L (ref 22–32)
Calcium: 9 mg/dL (ref 8.9–10.3)
Chloride: 103 mmol/L (ref 98–111)
Creatinine, Ser: 0.65 mg/dL (ref 0.44–1.00)
GFR, Estimated: >60 mL/min (ref >60)
Glucose, Bld: 95 mg/dL (ref 70–99)
Potassium: 3.8 mmol/L (ref 3.5–5.1)
Sodium: 137 mmol/L (ref 135–145)
Total Bilirubin: 0.5 mg/dL (ref 0.3–1.2)
Total Protein: 8.1 g/dL (ref 6.5–8.1)

## 2020-12-09 NOTE — ED Provider Notes (Signed)
Accepted from Dr. Charna Archer at [facility] pending CMP, lipase, and reassessment.  CMP showing mild elevation in AST of 73 with normal ALT alk phos and T bili, normal lipase.  Patient remains pain-free with no tenderness on palpation.  Discussed dietary changes and follow-up with surgery.  Discussed standard return precautions.  Patient be discharged home based on plan left by Dr. Charna Archer.   I have personally reviewed the images performed during this visit and I agree with the Radiologist's read.   Interpretation by Radiologist:  US Abdomen Limited RUQ (LIVER/GB)  Result Date: 12/08/2020 CLINICAL DATA:  Right upper quadrant pain for 1 week EXAM: ULTRASOUND ABDOMEN LIMITED RIGHT UPPER QUADRANT COMPARISON:  None. FINDINGS: Gallbladder: Gallbladder is partially distended with multiple gallstones within. No wall thickening or pericholecystic fluid is noted. Negative sonographic Murphy's sign is elicited. Common bile duct: Diameter: 1.3 mm. Liver: 1.6 cm cyst is noted in the right lobe of the liver. The liver is otherwise within normal limits. Portal vein is patent on color Doppler imaging with normal direction of blood flow towards the liver. Other: None. IMPRESSION: Cholelithiasis without complicating factors. Right hepatic cyst. Electronically Signed   By: Inez Catalina M.D.   On: 12/08/2020 23:40      Rudene Re, MD 12/09/20 [phone]

## 2020-12-09 NOTE — ED Notes (Signed)
Patient provided graham crackers and gingerale for PO challenge.

## 2020-12-16 ENCOUNTER — Ambulatory Visit: Payer: Self-pay | Admitting: Surgery

## 2020-12-16 ENCOUNTER — Ambulatory Visit (INDEPENDENT_AMBULATORY_CARE_PROVIDER_SITE_OTHER): Payer: BC Managed Care – PPO | Admitting: Surgery

## 2020-12-16 ENCOUNTER — Other Ambulatory Visit: Payer: Self-pay

## 2020-12-16 ENCOUNTER — Encounter: Payer: Self-pay | Admitting: Surgery

## 2020-12-16 ENCOUNTER — Telehealth: Payer: Self-pay | Admitting: Surgery

## 2020-12-16 VITALS — BP 110/76 | HR 66 | Temp 99.4°F | Ht 60.0 in | Wt 180.6 lb

## 2020-12-16 DIAGNOSIS — K8010 Calculus of gallbladder with chronic cholecystitis without obstruction: Secondary | ICD-10-CM

## 2020-12-16 NOTE — Patient Instructions (Signed)
Our surgery scheduler Britta MccreedyBarbara will call you within 24-48 hours to get you scheduled. If you have not heard from her after 48 hours, please call our office. You will not need to get Covid tested before surgery and have the blue sheet available when she calls to write down important information.  If you have any concerns or questions, please feel free to call our office.    Biliary Colic, Adult  Biliary colic is severe pain caused by a problem with the gallbladder. The gallbladder is a small organ in the upper right part of the abdomen. The gallbladder stores a digestive fluid produced in the liver (bile) that helps the body break down fat. Bile and other digestive enzymes are carried from the liver to the small intestine through tube-like structures called bile ducts. The gallbladder and the bile ducts form the biliary tract. Sometimes, hard deposits of digestive fluids (gallstones) form in the gallbladder and block the flow of bile from the gallbladder, causing biliary colic. This condition is also called a gallbladder attack. Gallstones can be as small as a grain of sand or as big as a golf ball. There could be just one gallstone in the gallbladder, or there could be many. What are the causes? This condition is usually caused by gallstones. Less often, a tumor could block the flow of bile from the gallbladder and trigger biliary colic. What increases the risk? The following factors may make you more likely to develop this condition:  Being female.  Having a family history of gallstones.  Being obese.  Losing weight suddenly or quickly.  Eating a diet that is high in calories, low in fiber, and rich in refined carbohydrates, such as white bread and white rice.  Having certain health conditions, such as: ? An intestinal disease that affects nutrient absorption, such as Crohn's disease. ? A metabolic condition, such as diabetes or metabolic syndrome. Metabolic syndrome occurs when someone  has high blood pressure, high cholesterol, and diabetes. ? A blood condition, such as hemolytic anemia or sickle cell disease. What are the signs or symptoms? The main symptom of this condition is severe pain in the upper right side of the abdomen. You may feel this pain below the chest but above the hip. This pain often occurs at night or after eating a meal that is high in fat. This pain may get worse for up to an hour and last as long as 12 hours. In most cases, the pain fades (subsides) within 2 hours. Other symptoms of this condition include:  Nausea and vomiting.  Pain under the right shoulder. How is this diagnosed? This condition is diagnosed based on your medical history, your symptoms, and a physical exam. You may also have tests, including:  Blood tests to rule out infection or inflammation of the bile ducts, gallbladder, pancreas, or liver.  Imaging studies, such as: ? An ultrasound. ? A CT scan. ? An MRI. In some cases, you may need to have an imaging study done using a small amount of radioactive material (nuclear medicine) to confirm the diagnosis. How is this treated? This condition may be treated with medicines to:  Relieve your pain or nausea.  Dissolve the gallstones. It may take months or years before the gallstones are completely gone. If you have gallstones, or if you have a tumor in the gallbladder that is causing biliary colic, you may need surgery to remove the gallbladder (cholecystectomy). Follow these instructions at home: Eating and drinking  Drink enough  fluid to keep your urine pale yellow.  Follow instructions from your health care provider about eating or drinking restrictions. These may include avoiding: ? Fatty, greasy, and fried foods. ? Any foods that make the pain worse. ? Overeating. ? Having a large meal after not eating for a while. General instructions  Take over-the-counter and prescription medicines only as told by your health care  provider.  Keep all follow-up visits as told by your health care provider. This is important. How is this prevented? Steps to prevent this condition include:  Maintaining a healthy body weight.  Getting regular exercise.  Eating a healthy diet that is high in fiber and low in fat.  Limiting how much sugar and refined carbohydrates you eat. Contact a health care provider if:  Your pain lasts more than 5 hours.  You vomit.  You have a fever and chills.  Your pain gets worse. Get help right away if:  Your skin or the whites of your eyes look yellow (jaundice).  Your have tea-colored urine and light-colored stools (feces).  You are dizzy or you faint. Summary  Biliary colic is severe pain caused by a problem with the gallbladder. The gallbladder is a small organ in the upper right part of your abdomen.  Treatment for this condition may include medicine to relieve your pain or nausea, or medicine to slowly dissolve the gallstones.  If you have gallstones, or if you have a tumor in the gallbladder that is causing biliary colic, you may need surgery to remove the gallbladder (cholecystectomy). This information is not intended to replace advice given to you by your health care provider. Make sure you discuss any questions you have with your health care provider. Document Revised: 07/29/2019 Document Reviewed: 05/20/2019 Elsevier Patient Education  2021 [employer]     Minimally Invasive Cholecystectomy Minimally invasive cholecystectomy is surgery to remove the gallbladder. The gallbladder is a pear-shaped organ that lies beneath the liver on the right side of the body. The gallbladder stores bile, which is a fluid that helps the body digest fats. Cholecystectomy is often done to treat inflammation of the gallbladder (cholecystitis). This condition is usually caused by a buildup of gallstones (cholelithiasis) in the gallbladder. Gallstones can block the flow of bile, which can  result in inflammation and pain. In severe cases, emergency surgery may be required. This procedure is done though small incisions in the abdomen, instead of one large incision. It is also called laparoscopic surgery. A thin scope with a camera (laparoscope) is inserted through one incision. Then surgical instruments are inserted through the other incisions. In some cases, a minimally invasive surgery may need to be changed to a surgery that is done through a larger incision. This is called open surgery. Tell a health care provider about:  Any allergies you have.  All medicines you are taking, including vitamins, herbs, eye drops, creams, and over-the-counter medicines.  Any problems you or family members have had with anesthetic medicines.  Any blood disorders you have.  Any surgeries you have had.  Any medical conditions you have.  Whether you are pregnant or may be pregnant. What are the risks? Generally, this is a safe procedure. However, problems may occur, including:  Infection.  Bleeding.  Allergic reactions to medicines.  Damage to nearby structures or organs.  A stone remaining in the common bile duct. The common bile duct carries bile from the gallbladder into the small intestine.  A bile leak from the cyst duct that  is clipped when your gallbladder is removed. What happens before the procedure? Staying hydrated Follow instructions from your health care provider about hydration, which may include:  Up to 2 hours before the procedure - you may continue to drink clear liquids, such as water, clear fruit juice, black coffee, and plain tea.   Eating and drinking restrictions Follow instructions from your health care provider about eating and drinking, which may include:  8 hours before the procedure - stop eating heavy meals or foods, such as meat, fried foods, or fatty foods.  6 hours before the procedure - stop eating light meals or foods, such as toast or  cereal.  6 hours before the procedure - stop drinking milk or drinks that contain milk.  2 hours before the procedure - stop drinking clear liquids. Medicines Ask your health care provider about:  Changing or stopping your regular medicines. This is especially important if you are taking diabetes medicines or blood thinners.  Taking medicines such as aspirin and ibuprofen. These medicines can thin your blood. Do not take these medicines unless your health care provider tells you to take them.  Taking over-the-counter medicines, vitamins, herbs, and supplements. General instructions  Let your health care provider know if you develop a cold or an infection before surgery.  Plan to have someone take you home from the hospital or clinic.  If you will be going home right after the procedure, plan to have someone with you for 24 hours.  Ask your health care provider: ? How your surgery site will be marked. ? What steps will be taken to help prevent infection. These may include:  Removing hair at the surgery site.  Washing skin with a germ-killing soap.  Taking antibiotic medicine. What happens during the procedure?  An IV will be inserted into one of your veins.  You will be given one or both of the following: ? A medicine to help you relax (sedative). ? A medicine to make you fall asleep (general anesthetic).  A breathing tube will be placed in your mouth.  Your surgeon will make several small incisions in your abdomen.  The laparoscope will be inserted through one of the small incisions. The camera on the laparoscope will send images to a monitor in the operating room. This lets your surgeon see inside your abdomen.  A gas will be pumped into your abdomen. This will expand your abdomen to give the surgeon more room to perform the surgery.  Other tools that are needed for the procedure will be inserted through the other incisions. The gallbladder will be removed through one  of the incisions.  Your common bile duct may be examined. If stones are found in the common bile duct, they may be removed.  After your gallbladder has been removed, the incisions will be closed with stitches (sutures), staples, or skin glue.  Your incisions may be covered with a bandage (dressing). The procedure may vary among health care providers and hospitals.   What happens after the procedure?  Your blood pressure, heart rate, breathing rate, and blood oxygen level will be monitored until you leave the hospital or clinic.  You will be given medicines as needed to control your pain.  If you were given a sedative during the procedure, it can affect you for several hours. Do not drive or operate machinery until your health care provider says that it is safe. Summary  Minimally invasive cholecystectomy, also called laparoscopic cholecystectomy, is surgery to remove the  gallbladder using small incisions.  Tell your health care provider about all the medical conditions you have and all the medicines you are taking for those conditions.  Before the procedure, follow instructions about eating or drinking restrictions and changing or stopping medicines.  If you were given a sedative during the procedure, it can affect you for several hours. Do not drive or operate machinery until your health care provider says that it is safe. This information is not intended to replace advice given to you by your health care provider. Make sure you discuss any questions you have with your health care provider. Document Revised: 04/21/2019 Document Reviewed: 04/21/2019 Elsevier Patient Education  2021 [employer].

## 2020-12-16 NOTE — Telephone Encounter (Signed)
Patient has been advised of Pre-Admission date/time, COVID Testing date and Surgery date.  Surgery Date: 12/22/20 Preadmission Testing Date: 12/20/20 (phone 8a-1p) Covid Testing Date: Not needed.     Patient has been made aware to call [phone], between 1-3:00pm the day before surgery, to find out what time to arrive for surgery.

## 2020-12-16 NOTE — H&P (View-Only) (Signed)
Patient ID: Deanna Vaughn, female   DOB: 08/02/1990, 30 y.o.   MRN: [id number]  Chief Complaint: Gallstones  History of Present Illness Deanna Vaughn is a 30 y.o. female with gallstones, seen in the ER on May 11.  She reports having symptoms late in the afternoon early evening, she did not realize any particular food or eating that provoked it.  The right upper quadrant pain was self-limiting and of milder degree, until May 11.  Her pain was significant, markedly exaggerated compared to before with pain under the right rib cage.  She reports vomiting associated with it, denying hematemesis or melena.  She has no history of fevers or chills.  She reports her bowel movements are normal.  She was made aware to avoid any fatty or fried foods.  She is currently not in any pain.  Past Medical History Past Medical History:  Diagnosis Date  . Anemia       Past Surgical History:  Procedure Laterality Date  . CESAREAN SECTION  2009  . CESAREAN SECTION WITH BILATERAL TUBAL LIGATION N/A 11/18/2015   Procedure: CESAREAN SECTION ;  Surgeon: Robert P Harris, MD;  Location: [facility] ORS;  Service: Obstetrics;  Laterality: N/A;  . CESAREAN SECTION WITH BILATERAL TUBAL LIGATION N/A 08/08/2018   Procedure: CESAREAN SECTION WITH BILATERAL TUBAL LIGATION;  Surgeon: Harris, Robert P, MD;  Location: [facility] ORS;  Service: Obstetrics;  Laterality: N/A;    No Known Allergies  No current outpatient medications on file.   No current facility-administered medications for this visit.    Family History Family History  Problem Relation Age of Onset  . Hypertension Mother   . Diabetes Mother       Social History Social History   Tobacco Use  . Smoking status: Never Smoker  . Smokeless tobacco: Never Used  Vaping Use  . Vaping Use: Never used  Substance Use Topics  . Alcohol use: No  . Drug use: No        Review of Systems  Constitutional: Negative.   HENT: Negative.   Eyes: Negative.   Respiratory:  Negative.   Cardiovascular: Negative.   Gastrointestinal: Positive for abdominal pain, nausea and vomiting.  Genitourinary: Negative.   Skin: Negative.   Neurological: Negative.   Psychiatric/Behavioral: Negative.      Physical Exam Blood pressure 110/76, pulse 66, temperature 99.4 F (37.4 C), temperature source Oral, height 5' (1.524 m), weight 180 lb 9.6 oz (81.9 kg), last menstrual period 12/01/2020, SpO2 98 %, unknown if currently breastfeeding. Last Weight  Most recent update: 12/16/2020  9:42 AM   Weight  81.9 kg (180 lb 9.6 oz)            CONSTITUTIONAL: Well developed, and nourished, appropriately responsive and aware without distress.   EYES: Sclera non-icteric.   EARS, NOSE, MOUTH AND THROAT: Mask worn.  Hearing is intact to voice.  NECK: Trachea is midline, and there is no jugular venous distension.  LYMPH NODES:  Lymph nodes in the neck are not enlarged. RESPIRATORY:  Lungs are clear, and breath sounds are equal bilaterally. Normal respiratory effort without pathologic use of accessory muscles. CARDIOVASCULAR: Heart is regular in rate and rhythm. GI: The abdomen is soft, nontender, and nondistended. There were no palpable masses. I did not appreciate hepatosplenomegaly. There were normal bowel sounds.  No objective right upper quadrant tenderness, no complaint of right upper quadrant tenderness on exam, negative Murphy sign. MUSCULOSKELETAL:  Symmetrical muscle tone appreciated in all four extremities.      SKIN: Skin turgor is normal. No pathologic skin lesions appreciated.  NEUROLOGIC:  Motor and sensation appear grossly normal.  Cranial nerves are grossly without defect. PSYCH:  Alert and oriented to person, place and time. Affect is appropriate for situation.  Data Reviewed I have personally reviewed what is currently available of the patient's imaging, recent labs and medical records.   Labs:  CBC Latest Ref Rng & Units 12/08/2020 08/10/2018 08/09/2018  WBC 4.0 - 10.5  K/uL 12.7(H) 11.1(H) -  Hemoglobin 12.0 - 15.0 g/dL 11.3(L) 7.6(L) 8.1(L)  Hematocrit 36.0 - 46.0 % 35.5(L) 23.6(L) 25.9(L)  Platelets 150 - 400 K/uL 248 160 -   CMP Latest Ref Rng & Units 12/08/2020 08/09/2018 04/23/2012  Glucose 70 - 99 mg/dL 95 62(M) 85  BUN 6 - 20 mg/dL 12 9 17   Creatinine 0.44 - 1.00 mg/dL 3.55 9.74  Sodium 135 - 145 mmol/L 137 135 140  Potassium 3.5 - 5.1 mmol/L 3.8 3.5 4.1  Chloride 98 - 111 mmol/L 103 111 106  CO2 22 - 32 mmol/L 24 19(L) 27  Calcium 8.9 - 10.3 mg/dL 9.0 1.63) 9.2  Total Protein 6.5 - 8.1 g/dL 8.1 - 7.8  Total Bilirubin 0.3 - 1.2 mg/dL 0.5 - 0.2  Alkaline Phos 38 - 126 U/L 32(L) - 36(L)  AST 15 - 41 U/L 73(H) - 14(L)  ALT 0 - 44 U/L 33 - 11(L)      Imaging: Radiology review:   CLINICAL DATA:  Right upper quadrant pain for 1 week  EXAM: ULTRASOUND ABDOMEN LIMITED RIGHT UPPER QUADRANT  COMPARISON:  None.  FINDINGS: Gallbladder:  Gallbladder is partially distended with multiple gallstones within. No wall thickening or pericholecystic fluid is noted. Negative sonographic Murphy's sign is elicited.  Common bile duct:  Diameter: 1.3 mm.  Liver:  1.6 cm cyst is noted in the right lobe of the liver. The liver is otherwise within normal limits. Portal vein is patent on color Doppler imaging with normal direction of blood flow towards the liver.  Other: None.  IMPRESSION: Cholelithiasis without complicating factors.  Right hepatic cyst.   Electronically Signed   By: 8.4(T M.D.   On: 12/08/2020 23:40  Within last 24 hrs: No results found.  Assessment    Chronic calculus cholecystitis with biliary colic  Patient Active Problem List   Diagnosis Date Noted  . History of cesarean delivery 11/18/2015  . S/P cesarean section 11/18/2015  . Family history of anencephaly 05/06/2015    Plan    Robotic cholecystectomy.  This patient was seen and examined, and I concur with the H&P associated with  this note.  This was discussed thoroughly.  Optimal plan is for robotic cholecystectomy.  Risks and benefits have been discussed with the patient which include but are not limited to anesthesia, bleeding, infection, biliary ductal injury or stenosis, other associated unanticipated injuries affiliated with laparoscopic surgery.  I believe there is the desire to proceed, interpreter utilized as needed.  Questions elicited and answered to satisfaction.  No guarantees ever expressed or implied.  Face-to-face time spent with the patient and accompanying care providers(if present) was 30 minutes, with more than 50% of the time spent counseling, educating, and coordinating care of the patient.    These notes generated with voice recognition software. I apologize for typographical errors.  07/06/2015 M.D., FACS 12/16/2020, 10:04 AM

## 2020-12-16 NOTE — Progress Notes (Signed)
Patient ID: Deanna Vaughn, female   DOB: 1991/01/09, 31 y.o.   MRN: [id number]  Chief Complaint: Gallstones  History of Present Illness Deanna Vaughn is a 30 y.o. female with gallstones, seen in the ER on May 11.  She reports having symptoms late in the afternoon early evening, she did not realize any particular food or eating that provoked it.  The right upper quadrant pain was self-limiting and of milder degree, until May 11.  Her pain was significant, markedly exaggerated compared to before with pain under the right rib cage.  She reports vomiting associated with it, denying hematemesis or melena.  She has no history of fevers or chills.  She reports her bowel movements are normal.  She was made aware to avoid any fatty or fried foods.  She is currently not in any pain.  Past Medical History Past Medical History:  Diagnosis Date  . Anemia       Past Surgical History:  Procedure Laterality Date  . CESAREAN SECTION  2009  . CESAREAN SECTION WITH BILATERAL TUBAL LIGATION N/A 11/18/2015   Procedure: CESAREAN SECTION ;  Surgeon: Nadara Mustard, MD;  Location: [facility] ORS;  Service: Obstetrics;  Laterality: N/A;  . CESAREAN SECTION WITH BILATERAL TUBAL LIGATION N/A 08/08/2018   Procedure: CESAREAN SECTION WITH BILATERAL TUBAL LIGATION;  Surgeon: Nadara Mustard, MD;  Location: [facility];  Service: Obstetrics;  Laterality: N/A;    No Known Allergies  No current outpatient medications on file.   No current facility-administered medications for this visit.    Family History Family History  Problem Relation Age of Onset  . Hypertension Mother   . Diabetes Mother       Social History Social History   Tobacco Use  . Smoking status: Never Smoker  . Smokeless tobacco: Never Used  Vaping Use  . Vaping Use: Never used  Substance Use Topics  . Alcohol use: No  . Drug use: No        Review of Systems  Constitutional: Negative.   HENT: Negative.   Eyes: Negative.   Respiratory:  Negative.   Cardiovascular: Negative.   Gastrointestinal: Positive for abdominal pain, nausea and vomiting.  Genitourinary: Negative.   Skin: Negative.   Neurological: Negative.   Psychiatric/Behavioral: Negative.      Physical Exam Blood pressure 110/76, pulse 66, temperature 99.4 F (37.4 C), temperature source Oral, height 5' (1.524 m), weight 180 lb 9.6 oz (81.9 kg), last menstrual period 12/01/2020, SpO2 98 %, unknown if currently breastfeeding. Last Weight  Most recent update: 12/16/2020  9:42 AM   Weight  81.9 kg (180 lb 9.6 oz)            CONSTITUTIONAL: Well developed, and nourished, appropriately responsive and aware without distress.   EYES: Sclera non-icteric.   EARS, NOSE, MOUTH AND THROAT: Mask worn.  Hearing is intact to voice.  NECK: Trachea is midline, and there is no jugular venous distension.  LYMPH NODES:  Lymph nodes in the neck are not enlarged. RESPIRATORY:  Lungs are clear, and breath sounds are equal bilaterally. Normal respiratory effort without pathologic use of accessory muscles. CARDIOVASCULAR: Heart is regular in rate and rhythm. GI: The abdomen is soft, nontender, and nondistended. There were no palpable masses. I did not appreciate hepatosplenomegaly. There were normal bowel sounds.  No objective right upper quadrant tenderness, no complaint of right upper quadrant tenderness on exam, negative Murphy sign. MUSCULOSKELETAL:  Symmetrical muscle tone appreciated in all four extremities.  SKIN: Skin turgor is normal. No pathologic skin lesions appreciated.  NEUROLOGIC:  Motor and sensation appear grossly normal.  Cranial nerves are grossly without defect. PSYCH:  Alert and oriented to person, place and time. Affect is appropriate for situation.  Data Reviewed I have personally reviewed what is currently available of the patient's imaging, recent labs and medical records.   Labs:  CBC Latest Ref Rng & Units 12/08/2020 08/10/2018 08/09/2018  WBC 4.0 - 10.5  K/uL 12.7(H) 11.1(H) -  Hemoglobin 12.0 - 15.0 g/dL 11.3(L) 7.6(L) 8.1(L)  Hematocrit 36.0 - 46.0 % 35.5(L) 23.6(L) 25.9(L)  Platelets 150 - 400 K/uL 248 160 -   CMP Latest Ref Rng & Units 12/08/2020 08/09/2018 04/23/2012  Glucose 70 - 99 mg/dL 95 62(M) 85  BUN 6 - 20 mg/dL 12 9 17   Creatinine 0.44 - 1.00 mg/dL 3.55 9.74  Sodium 135 - 145 mmol/L 137 135 140  Potassium 3.5 - 5.1 mmol/L 3.8 3.5 4.1  Chloride 98 - 111 mmol/L 103 111 106  CO2 22 - 32 mmol/L 24 19(L) 27  Calcium 8.9 - 10.3 mg/dL 9.0 1.63) 9.2  Total Protein 6.5 - 8.1 g/dL 8.1 - 7.8  Total Bilirubin 0.3 - 1.2 mg/dL 0.5 - 0.2  Alkaline Phos 38 - 126 U/L 32(L) - 36(L)  AST 15 - 41 U/L 73(H) - 14(L)  ALT 0 - 44 U/L 33 - 11(L)      Imaging: Radiology review:   CLINICAL DATA:  Right upper quadrant pain for 1 week  EXAM: ULTRASOUND ABDOMEN LIMITED RIGHT UPPER QUADRANT  COMPARISON:  None.  FINDINGS: Gallbladder:  Gallbladder is partially distended with multiple gallstones within. No wall thickening or pericholecystic fluid is noted. Negative sonographic Murphy's sign is elicited.  Common bile duct:  Diameter: 1.3 mm.  Liver:  1.6 cm cyst is noted in the right lobe of the liver. The liver is otherwise within normal limits. Portal vein is patent on color Doppler imaging with normal direction of blood flow towards the liver.  Other: None.  IMPRESSION: Cholelithiasis without complicating factors.  Right hepatic cyst.   Electronically Signed   By: 8.4(T M.D.   On: 12/08/2020 23:40  Within last 24 hrs: No results found.  Assessment    Chronic calculus cholecystitis with biliary colic  Patient Active Problem List   Diagnosis Date Noted  . History of cesarean delivery 11/18/2015  . S/P cesarean section 11/18/2015  . Family history of anencephaly 05/06/2015    Plan    Robotic cholecystectomy.  This patient was seen and examined, and I concur with the H&P associated with  this note.  This was discussed thoroughly.  Optimal plan is for robotic cholecystectomy.  Risks and benefits have been discussed with the patient which include but are not limited to anesthesia, bleeding, infection, biliary ductal injury or stenosis, other associated unanticipated injuries affiliated with laparoscopic surgery.  I believe there is the desire to proceed, interpreter utilized as needed.  Questions elicited and answered to satisfaction.  No guarantees ever expressed or implied.  Face-to-face time spent with the patient and accompanying care providers(if present) was 30 minutes, with more than 50% of the time spent counseling, educating, and coordinating care of the patient.    These notes generated with voice recognition software. I apologize for typographical errors.  07/06/2015 M.D., FACS 12/16/2020, 10:04 AM

## 2020-12-20 ENCOUNTER — Encounter
Admission: RE | Admit: 2020-12-20 | Discharge: 2020-12-20 | Disposition: A | Discharge status: Home / Self Care | Payer: BC Managed Care – PPO | Source: Ambulatory Visit | Attending: Surgery | Admitting: Surgery

## 2020-12-20 ENCOUNTER — Other Ambulatory Visit: Payer: Self-pay

## 2020-12-20 NOTE — Patient Instructions (Addendum)
Your procedure is scheduled on: Dec 22, 2020 [facility] Report to the Registration Desk on the 1st floor of the Medical Mall. To find out your arrival time, please call [phone] between 1PM - 3PM on: Tuesday Dec 21, 2020  REMEMBER: Instructions that are not followed completely may result in serious medical risk, up to and including death; or upon the discretion of your surgeon and anesthesiologist your surgery may need to be rescheduled.  Do not EAT OR DRINK after midnight the night before surgery.  No gum chewing, lozengers or hard candies.  Do NOT drink anything that is not on this list.  Type 1 and Type 2 diabetics should only drink water.  TAKE THESE MEDICATIONS THE MORNING OF SURGERY WITH A SIP OF WATER: NONE   One week prior to surgery: Stop Anti-inflammatories (NSAIDS) such as Advil, Aleve, Ibuprofen, Motrin, Naproxen, Naprosyn and ASPIRIN OR Aspirin based products such as Excedrin, Goodys Powder, BC Powder.  USE TYLENOL IF NEEDED Stop ANY OVER THE COUNTER supplements until after surgery.  No Alcohol for 24 hours before or after surgery.  No Smoking including e-cigarettes for 24 hours prior to surgery.  No chewable tobacco products for at least 6 hours prior to surgery.  No nicotine patches on the day of surgery.  Do not use any "recreational" drugs for at least a week prior to your surgery.  Please be advised that the combination of cocaine and anesthesia may have negative outcomes, up to and including death. If you test positive for cocaine, your surgery will be cancelled.  On the morning of surgery brush your teeth with toothpaste and water, you may rinse your mouth with mouthwash if you wish. Do not swallow any toothpaste or mouthwash.  Do not wear jewelry, make-up, hairpins, clips or nail polish.  Do not wear lotions, powders, or perfumes OR DEODORANT  Do not shave body from the neck down 48 hours prior to surgery just in case you cut yourself which could leave  a site for infection.  Also, freshly shaved skin may become irritated if using the CHG soap.  Contact lenses, hearing aids and dentures may not be worn into surgery.  Do not bring valuables to the hospital. [facility] is not responsible for any missing/lost belongings or valuables.   Use CHG Soap as directed on instruction sheet.  Notify your doctor if there is any change in your medical condition (cold, fever, infection).  Wear comfortable clothing (specific to your surgery type) to the hospital.  Plan for stool softeners for home use; pain medications have a tendency to cause constipation. You can also help prevent constipation by eating foods high in fiber such as fruits and vegetables and drinking plenty of fluids as your diet allows.  After surgery, you can help prevent lung complications by doing breathing exercises.  Take deep breaths and cough every 1-2 hours. Your doctor may order a device called an Incentive Spirometer to help you take deep breaths. When coughing or sneezing, hold a pillow firmly against your incision with both hands. This is called "splinting." Doing this helps protect your incision. It also decreases belly discomfort.  If you are being discharged the day of surgery, you will not be allowed to drive home. You will need a responsible adult (18 years or older) to drive you home and stay with you that night.    Please call the Pre-admissions Testing Dept. at [phone] if you have any questions about these instructions.  Surgery Visitation Policy:  Patients undergoing a surgery or procedure may have one family member or support person with them as long as that person is not COVID-19 positive or experiencing its symptoms.  That person may remain in the waiting area during the procedure.  Inpatient Visitation:    Visiting hours are 7 a.m. to 8 p.m. Inpatients will be allowed two visitors daily. The visitors may change each day during the patient's  stay. No visitors under the age of 77. Any visitor under the age of 43 must be accompanied by an adult. The visitor must pass COVID-19 screenings, use hand sanitizer when entering and exiting the patient's room and wear a mask at all times, including in the patient's room. Patients must also wear a mask when staff or their visitor are in the room. Masking is required regardless of vaccination status.

## 2020-12-21 MED ORDER — CHLORHEXIDINE GLUCONATE CLOTH 2 % EX PADS: 6.0000 | MEDICATED_PAD | Freq: Once | CUTANEOUS | Status: DC

## 2020-12-21 MED ORDER — FAMOTIDINE 20 MG PO TABS: 20.0000 mg | ORAL_TABLET | Freq: Once | ORAL | Status: AC

## 2020-12-21 MED ORDER — INDOCYANINE GREEN 25 MG IV SOLR
2.5000 mg | Freq: Once | INTRAVENOUS | Status: AC
  Administered 2020-12-22: 2.5 mg via INTRAVENOUS
  Filled 2020-12-21: qty 1

## 2020-12-21 MED ORDER — ORAL CARE MOUTH RINSE: 15.0000 mL | Freq: Once | OROMUCOSAL | Status: DC

## 2020-12-21 MED ORDER — CELECOXIB 200 MG PO CAPS
200.0000 mg | ORAL_CAPSULE | ORAL | Status: AC
Start: 2020-12-22 — End: 2020-12-22

## 2020-12-21 MED ORDER — CEFAZOLIN SODIUM-DEXTROSE 2-4 GM/100ML-% IV SOLN
2.0000 g | INTRAVENOUS | Status: AC
  Administered 2020-12-22: 2 g via INTRAVENOUS

## 2020-12-21 MED ORDER — LACTATED RINGERS IV SOLN: INTRAVENOUS | Status: DC

## 2020-12-21 MED ORDER — BUPIVACAINE LIPOSOME 1.3 % IJ SUSP: 20.0000 mL | Freq: Once | INTRAMUSCULAR | Status: DC

## 2020-12-21 MED ORDER — ACETAMINOPHEN 500 MG PO TABS
1000.0000 mg | ORAL_TABLET | ORAL | Status: AC
Start: 2020-12-22 — End: 2020-12-22

## 2020-12-21 MED ORDER — GABAPENTIN 300 MG PO CAPS: 300.0000 mg | ORAL_CAPSULE | ORAL | Status: AC

## 2020-12-21 MED ORDER — CHLORHEXIDINE GLUCONATE 0.12 % MT SOLN: 15.0000 mL | Freq: Once | OROMUCOSAL | Status: DC

## 2020-12-22 ENCOUNTER — Ambulatory Visit: Payer: BC Managed Care – PPO | Admitting: Certified Registered"

## 2020-12-22 ENCOUNTER — Ambulatory Visit
Admission: RE | Admit: 2020-12-22 | Discharge: 2020-12-22 | Disposition: A | Discharge status: Home / Self Care | Payer: BC Managed Care – PPO | Attending: Surgery | Admitting: Surgery

## 2020-12-22 ENCOUNTER — Encounter: Payer: Self-pay | Admitting: Surgery

## 2020-12-22 ENCOUNTER — Other Ambulatory Visit: Payer: Self-pay

## 2020-12-22 ENCOUNTER — Telehealth: Payer: Self-pay | Admitting: *Deleted

## 2020-12-22 ENCOUNTER — Encounter
Admission: RE | Disposition: A | Discharge status: Home / Self Care | Payer: Self-pay | Source: Home / Self Care | Attending: Surgery

## 2020-12-22 DIAGNOSIS — Z833 Family history of diabetes mellitus: Secondary | ICD-10-CM | POA: Insufficient documentation

## 2020-12-22 DIAGNOSIS — K828 Other specified diseases of gallbladder: Secondary | ICD-10-CM | POA: Insufficient documentation

## 2020-12-22 DIAGNOSIS — K8010 Calculus of gallbladder with chronic cholecystitis without obstruction: Secondary | ICD-10-CM | POA: Diagnosis not present

## 2020-12-22 DIAGNOSIS — Z8249 Family history of ischemic heart disease and other diseases of the circulatory system: Secondary | ICD-10-CM | POA: Diagnosis not present

## 2020-12-22 DIAGNOSIS — K810 Acute cholecystitis: Secondary | ICD-10-CM | POA: Diagnosis not present

## 2020-12-22 LAB — POCT PREGNANCY, URINE: Preg Test, Ur: NEGATIVE

## 2020-12-22 SURGERY — CHOLECYSTECTOMY, ROBOT-ASSISTED, LAPAROSCOPIC
Anesthesia: General

## 2020-12-22 MED ORDER — FENTANYL CITRATE (PF) 100 MCG/2ML IJ SOLN
INTRAMUSCULAR | Status: AC
  Filled 2020-12-22: qty 2

## 2020-12-22 MED ORDER — PHENYLEPHRINE HCL (PRESSORS) 10 MG/ML IV SOLN
INTRAVENOUS | Status: DC | PRN
  Administered 2020-12-22: 200 ug via INTRAVENOUS
  Administered 2020-12-22: 100 ug via INTRAVENOUS
  Administered 2020-12-22: 200 ug via INTRAVENOUS
  Administered 2020-12-22: 100 ug via INTRAVENOUS

## 2020-12-22 MED ORDER — FENTANYL CITRATE (PF) 100 MCG/2ML IJ SOLN
25.0000 ug | INTRAMUSCULAR | Status: DC | PRN
  Administered 2020-12-22 (×2): 25 ug via INTRAVENOUS

## 2020-12-22 MED ORDER — MIDAZOLAM HCL 2 MG/2ML IJ SOLN
INTRAMUSCULAR | Status: AC
  Filled 2020-12-22: qty 2

## 2020-12-22 MED ORDER — HYDROCODONE-ACETAMINOPHEN 5-325 MG PO TABS: 1.0000 | ORAL_TABLET | Freq: Once | ORAL | Status: DC

## 2020-12-22 MED ORDER — ONDANSETRON HCL 4 MG/2ML IJ SOLN
INTRAMUSCULAR | Status: AC
  Filled 2020-12-22: qty 2

## 2020-12-22 MED ORDER — IBUPROFEN 800 MG PO TABS: 800.0000 mg | ORAL_TABLET | Freq: Three times a day (TID) | ORAL | 0 refills | Status: DC | PRN

## 2020-12-22 MED ORDER — CELECOXIB 200 MG PO CAPS
ORAL_CAPSULE | ORAL | Status: AC
  Administered 2020-12-22: 200 mg via ORAL
  Filled 2020-12-22: qty 1

## 2020-12-22 MED ORDER — METOCLOPRAMIDE HCL 5 MG/ML IJ SOLN
INTRAMUSCULAR | Status: AC
  Administered 2020-12-22: 10 mg
  Filled 2020-12-22: qty 2

## 2020-12-22 MED ORDER — DEXAMETHASONE SODIUM PHOSPHATE 10 MG/ML IJ SOLN
INTRAMUSCULAR | Status: AC
  Filled 2020-12-22: qty 1

## 2020-12-22 MED ORDER — MIDAZOLAM HCL 2 MG/2ML IJ SOLN
INTRAMUSCULAR | Status: DC | PRN
  Administered 2020-12-22: 2 mg via INTRAVENOUS

## 2020-12-22 MED ORDER — ONDANSETRON HCL 4 MG/2ML IJ SOLN
4.0000 mg | Freq: Once | INTRAMUSCULAR | Status: AC | PRN
  Administered 2020-12-22: 4 mg via INTRAVENOUS

## 2020-12-22 MED ORDER — MEPERIDINE HCL 25 MG/ML IJ SOLN: 6.2500 mg | INTRAMUSCULAR | Status: DC | PRN

## 2020-12-22 MED ORDER — ROCURONIUM BROMIDE 100 MG/10ML IV SOLN
INTRAVENOUS | Status: DC | PRN
  Administered 2020-12-22: 50 mg via INTRAVENOUS

## 2020-12-22 MED ORDER — PROPOFOL 10 MG/ML IV BOLUS
INTRAVENOUS | Status: DC | PRN
  Administered 2020-12-22: 200 mg via INTRAVENOUS

## 2020-12-22 MED ORDER — DEXAMETHASONE SODIUM PHOSPHATE 10 MG/ML IJ SOLN
INTRAMUSCULAR | Status: DC | PRN
  Administered 2020-12-22: 10 mg via INTRAVENOUS

## 2020-12-22 MED ORDER — CEFAZOLIN SODIUM-DEXTROSE 2-4 GM/100ML-% IV SOLN
INTRAVENOUS | Status: AC
  Filled 2020-12-22: qty 100

## 2020-12-22 MED ORDER — CHLORHEXIDINE GLUCONATE 0.12 % MT SOLN
OROMUCOSAL | Status: AC
  Filled 2020-12-22: qty 15

## 2020-12-22 MED ORDER — GABAPENTIN 300 MG PO CAPS
ORAL_CAPSULE | ORAL | Status: AC
  Administered 2020-12-22: 300 mg via ORAL
  Filled 2020-12-22: qty 1

## 2020-12-22 MED ORDER — BUPIVACAINE-EPINEPHRINE (PF) 0.25% -1:200000 IJ SOLN
INTRAMUSCULAR | Status: AC
  Filled 2020-12-22: qty 30

## 2020-12-22 MED ORDER — ONDANSETRON HCL 4 MG/2ML IJ SOLN
INTRAMUSCULAR | Status: DC | PRN
  Administered 2020-12-22: 4 mg via INTRAVENOUS

## 2020-12-22 MED ORDER — ACETAMINOPHEN 500 MG PO TABS
ORAL_TABLET | ORAL | Status: AC
  Administered 2020-12-22: 1000 mg via ORAL
  Filled 2020-12-22: qty 2

## 2020-12-22 MED ORDER — FAMOTIDINE 20 MG PO TABS
ORAL_TABLET | ORAL | Status: AC
  Administered 2020-12-22: 20 mg via ORAL
  Filled 2020-12-22: qty 1

## 2020-12-22 MED ORDER — PROPOFOL 10 MG/ML IV BOLUS
INTRAVENOUS | Status: AC
  Filled 2020-12-22: qty 20

## 2020-12-22 MED ORDER — DEXMEDETOMIDINE (PRECEDEX) IN NS 20 MCG/5ML (4 MCG/ML) IV SYRINGE
PREFILLED_SYRINGE | INTRAVENOUS | Status: DC | PRN
  Administered 2020-12-22: 4 ug via INTRAVENOUS
  Administered 2020-12-22 (×2): 8 ug via INTRAVENOUS

## 2020-12-22 MED ORDER — BUPIVACAINE LIPOSOME 1.3 % IJ SUSP
INTRAMUSCULAR | Status: AC
  Filled 2020-12-22: qty 20

## 2020-12-22 MED ORDER — FENTANYL CITRATE (PF) 100 MCG/2ML IJ SOLN
INTRAMUSCULAR | Status: DC | PRN
  Administered 2020-12-22: 50 ug via INTRAVENOUS
  Administered 2020-12-22: 100 ug via INTRAVENOUS

## 2020-12-22 MED ORDER — HYDROCODONE-ACETAMINOPHEN 5-325 MG PO TABS: 1.0000 | ORAL_TABLET | Freq: Four times a day (QID) | ORAL | 0 refills | Status: DC | PRN

## 2020-12-22 MED ORDER — LIDOCAINE HCL (CARDIAC) PF 100 MG/5ML IV SOSY
PREFILLED_SYRINGE | INTRAVENOUS | Status: DC | PRN
  Administered 2020-12-22: 40 mg via INTRAVENOUS

## 2020-12-22 MED ORDER — SUGAMMADEX SODIUM 200 MG/2ML IV SOLN
INTRAVENOUS | Status: DC | PRN
  Administered 2020-12-22: 200 mg via INTRAVENOUS

## 2020-12-22 MED ORDER — DEXMEDETOMIDINE (PRECEDEX) IN NS 20 MCG/5ML (4 MCG/ML) IV SYRINGE
PREFILLED_SYRINGE | INTRAVENOUS | Status: AC
  Filled 2020-12-22: qty 5

## 2020-12-22 MED ORDER — BUPIVACAINE-EPINEPHRINE (PF) 0.25% -1:200000 IJ SOLN
INTRAMUSCULAR | Status: DC | PRN
  Administered 2020-12-22: 30 mL

## 2020-12-22 SURGICAL SUPPLY — 49 items
ADH SKN CLS APL DERMABOND .7 (GAUZE/BANDAGES/DRESSINGS) ×2
APL PRP STRL LF DISP 70% ISPRP (MISCELLANEOUS) ×2
BAG SPEC RTRVL LRG 6X4 10 (ENDOMECHANICALS) ×2
CANISTER SUCT 1200ML W/VALVE (MISCELLANEOUS) IMPLANT
CANNULA CAP OBTURATR AIRSEAL 8 (CAP) ×3 IMPLANT
CHLORAPREP W/TINT 26 (MISCELLANEOUS) ×3 IMPLANT
CLIP VESOLOCK LG 6/CT PURPLE (CLIP) ×3 IMPLANT
COVER TIP SHEARS 8 DVNC (MISCELLANEOUS) ×2 IMPLANT
COVER TIP SHEARS 8MM DA VINCI (MISCELLANEOUS) ×1
COVER WAND RF STERILE (DRAPES) ×3 IMPLANT
DECANTER SPIKE VIAL GLASS SM (MISCELLANEOUS) ×3 IMPLANT
DEFOGGER SCOPE WARMER CLEARIFY (MISCELLANEOUS) ×3 IMPLANT
DERMABOND ADVANCED (GAUZE/BANDAGES/DRESSINGS) ×1
DERMABOND ADVANCED .7 DNX12 (GAUZE/BANDAGES/DRESSINGS) ×2 IMPLANT
DRAPE ARM DVNC X/XI (DISPOSABLE) ×8 IMPLANT
DRAPE COLUMN DVNC XI (DISPOSABLE) ×2 IMPLANT
DRAPE DA VINCI XI ARM (DISPOSABLE) ×4
DRAPE DA VINCI XI COLUMN (DISPOSABLE) ×1
ELECT CAUTERY BLADE 6.4 (BLADE) ×3 IMPLANT
GLOVE SURG ORTHO LTX SZ7.5 (GLOVE) ×12 IMPLANT
GOWN STRL REUS W/ TWL LRG LVL3 (GOWN DISPOSABLE) ×8 IMPLANT
GOWN STRL REUS W/TWL LRG LVL3 (GOWN DISPOSABLE) ×12
GRASPER SUT TROCAR 14GX15 (MISCELLANEOUS) ×3 IMPLANT
INFUSOR MANOMETER BAG 3000ML (MISCELLANEOUS) IMPLANT
IRRIGATION STRYKERFLOW (MISCELLANEOUS) IMPLANT
IRRIGATOR STRYKERFLOW (MISCELLANEOUS)
IRRIGATOR SUCT 8 DISP DVNC XI (IRRIGATION / IRRIGATOR) IMPLANT
IRRIGATOR SUCTION 8MM XI DISP (IRRIGATION / IRRIGATOR)
IV NS IRRIG 3000ML ARTHROMATIC (IV SOLUTION) IMPLANT
KIT PINK PAD W/HEAD ARE REST (MISCELLANEOUS) ×3
KIT PINK PAD W/HEAD ARM REST (MISCELLANEOUS) ×2 IMPLANT
KIT TURNOVER KIT A (KITS) ×3 IMPLANT
LABEL OR SOLS (LABEL) ×3 IMPLANT
MANIFOLD NEPTUNE II (INSTRUMENTS) ×3 IMPLANT
NEEDLE HYPO 22GX1.5 SAFETY (NEEDLE) ×3 IMPLANT
NEEDLE INSUFFLATION 14GA 120MM (NEEDLE) IMPLANT
NS IRRIG 500ML POUR BTL (IV SOLUTION) ×3 IMPLANT
PACK LAP CHOLECYSTECTOMY (MISCELLANEOUS) ×3 IMPLANT
PENCIL ELECTRO HAND CTR (MISCELLANEOUS) ×3 IMPLANT
POUCH SPECIMEN RETRIEVAL 10MM (ENDOMECHANICALS) ×3 IMPLANT
SEAL CANN UNIV 5-8 DVNC XI (MISCELLANEOUS) ×6 IMPLANT
SEAL XI 5MM-8MM UNIVERSAL (MISCELLANEOUS) ×3
SET TUBE FILTERED XL AIRSEAL (SET/KITS/TRAYS/PACK) ×3 IMPLANT
SOLUTION ELECTROLUBE (MISCELLANEOUS) ×3 IMPLANT
SUT MNCRL 4-0 (SUTURE) ×3
SUT MNCRL 4-0 27XMFL (SUTURE) ×2
SUT VICRYL 0 AB UR-6 (SUTURE) ×3 IMPLANT
SUTURE MNCRL 4-0 27XMF (SUTURE) ×2 IMPLANT
TROCAR Z-THREAD FIOS 11X100 BL (TROCAR) ×3 IMPLANT

## 2020-12-22 NOTE — Op Note (Signed)
Robotic cholecystectomy  Pre-operative Diagnosis: Chronic calculus cholecystitis  Post-operative Diagnosis:  Same.  Procedure: Robotic assisted laparoscopic cholecystectomy.  Surgeon: Campbell Lerner, M.D., FACS  Anesthesia: General. with endotracheal tube  Findings: Multiple well-developed stones.  Estimated Blood Loss: 10 mL         Drains: None         Specimens: Gallbladder           Complications: none  Procedure Details  The patient was seen again in the Holding Room.  2.5 mg dose of ICG was administered intravenously.   The benefits, complications, treatment options, risks and expected outcomes were again reviewed with the patient. The likelihood of improving the patient's symptoms with return to their baseline status is good.  The patient and/or family concurred with the proposed plan, giving informed consent, again alternatives reviewed.  The patient was taken to Operating Room, identified, and the procedure verified as robotic assisted laparoscopic cholecystectomy.  Prior to the induction of general anesthesia, antibiotic prophylaxis was administered. VTE prophylaxis was in place. General endotracheal anesthesia was then administered and tolerated well. The patient was positioned in the supine position.  After the induction, the abdomen was prepped with Chloraprep and draped in the sterile fashion.  A Time Out was held and the above information confirmed.  After local infiltration of quarter percent Marcaine with epinephrine, stab incision was made left upper quadrant.  Just below the costal margin at Palmer's point, approximately midclavicular line the Veres needle is passed with sensation of the layers to penetrate the abdominal wall and into the peritoneum.  Saline drop test is confirmed peritoneal placement.  Insufflation is initiated with carbon dioxide to pressures of 15 mmHg.  Right infra-umbilical local infiltration with quarter percent Marcaine with epinephrine is  utilized.  Made a 12 mm incision on the right periumbilical site, I advanced an optical 24mm port under direct visualization into the peritoneal cavity.  Once the peritoneum was penetrated, insufflation was initiated.  The trocar was then advanced into the abdominal cavity under direct visualization. Pneumoperitoneum was then continued with Air seal utilizing CO2 at 15 mmHg or less and tolerated well without any adverse changes in the patient's vital signs.  Two 8.5-mm ports were placed in the left lower quadrant and laterally, and one to the right lower quadrant, all under direct vision. All skin incisions  were infiltrated with a local anesthetic agent before making the incision and placing the trocars.  The patient was positioned  in reverse Trendelenburg, tilted the patient's left side down.  Da Vinci XI robot was then positioned on to the patient's left side, and docked.  The gallbladder was identified, it was somewhat intrahepatic, the fundus grasped via the arm 4 Prograsp and retracted cephalad. Adhesions were lysed with scissors and cautery.  The infundibulum was identified grasped and retracted laterally, exposing the peritoneum overlying the triangle of Calot. This was then opened and dissected using cautery & scissors. An extended critical view of the cystic duct and cystic artery was obtained, aided by the ICG via FireFly which enabled ready visualization of the ductal anatomy.    The cystic duct was clearly identified and dissected to isolation.   Artery well isolated, and the cystic duct was triple clipped and divided with scissors, as close to the gallbladder neck as feasible, thus leaving two on the remaining stump.  The cystic artery is sealed with bipolar and divided with monopolar scissors.   The gallbladder was taken from the gallbladder fossa in  a retrograde fashion with the electrocautery. The gallbladder was removed and placed in an Endocatch bag.  The liver bed is inspected.  Hemostasis was confirmed.  The robot was undocked and moved away from the operative field. No irrigation was utilized. The gallbladder and Endocatch sac were then removed through the infraumbilical port site.   Inspection of the right upper quadrant was performed. No bleeding, bile duct injury or leak, or bowel injury was noted. The infra-umbilical port site fascia was closed with interrumpted 0 Vicryl sutures using PMI/cone under direct visualization. Pneumoperitoneum was released and ports removed.  4-0 subcuticular Monocryl was used to close the skin. Dermabond was  applied.  The patient was then extubated and brought to the recovery room in stable condition. Sponge, lap, and needle counts were correct at closure and at the conclusion of the case.               Campbell Lerner, M.D., [facility] 12/22/2020 3:58 PM

## 2020-12-22 NOTE — Anesthesia Postprocedure Evaluation (Signed)
Anesthesia Post Note  Patient: Deanna Vaughn  Procedure(s) Performed: XI ROBOTIC ASSISTED LAPAROSCOPIC CHOLECYSTECTOMY (N/A ) INDOCYANINE GREEN FLUORESCENCE IMAGING (ICG)  Patient location during evaluation: PACU Anesthesia Type: General Level of consciousness: awake and alert Pain management: pain level controlled Vital Signs Assessment: post-procedure vital signs reviewed and stable Respiratory status: spontaneous breathing and respiratory function stable Cardiovascular status: stable Anesthetic complications: no   No complications documented.   Last Vitals:  Vitals:   12/22/20 1645 12/22/20 1658  BP: 111/70 114/71  Pulse: 96 92  Resp: 15 19  Temp:    SpO2: 100% 98%    Last Pain:  Vitals:   12/22/20 1645  TempSrc:   PainSc: 0-No pain                 Willam Munford K

## 2020-12-22 NOTE — Transfer of Care (Signed)
Immediate Anesthesia Transfer of Care Note  Patient: Deanna Vaughn  Procedure(s) Performed: XI ROBOTIC ASSISTED LAPAROSCOPIC CHOLECYSTECTOMY (N/A ) INDOCYANINE GREEN FLUORESCENCE IMAGING (ICG)  Patient Location: PACU  Anesthesia Type:General  Level of Consciousness: drowsy and patient cooperative  Airway & Oxygen Therapy: Patient Spontanous Breathing and Patient connected to face mask oxygen  Post-op Assessment: Report given to RN and Post -op Vital signs reviewed and stable  Post vital signs: Reviewed and stable  Last Vitals:  Vitals Value Taken Time  BP 105/62 12/22/20 1617  Temp    Pulse 95 12/22/20 1620  Resp 17 12/22/20 1620  SpO2 100 % 12/22/20 1620  Vitals shown include unvalidated device data.  Last Pain:  Vitals:   12/22/20 1352  TempSrc: Oral  PainSc: 0-No pain         Complications: No complications documented.

## 2020-12-22 NOTE — Discharge Instructions (Signed)

## 2020-12-22 NOTE — Anesthesia Procedure Notes (Signed)
Procedure Name: Intubation Date/Time: 12/22/2020 2:38 PM Performed by: Rona Ravens, CRNA Pre-anesthesia Checklist: Patient identified, Patient being monitored, Timeout performed, Emergency Drugs available and Suction available Patient Re-evaluated:Patient Re-evaluated prior to induction Oxygen Delivery Method: Circle system utilized Preoxygenation: Pre-oxygenation with 100% oxygen Induction Type: IV induction Ventilation: Mask ventilation without difficulty Laryngoscope Size: Mac and 3 Grade View: Grade I Tube type: Oral Tube size: 7.0 mm Number of attempts: 1 Airway Equipment and Method: Stylet Placement Confirmation: ETT inserted through vocal cords under direct vision,  positive ETCO2 and breath sounds checked- equal and bilateral Secured at: 22 cm Tube secured with: Tape Dental Injury: Teeth and Oropharynx as per pre-operative assessment

## 2020-12-22 NOTE — Telephone Encounter (Signed)
Faxed FMLA to [employer] at [phone]

## 2020-12-22 NOTE — Anesthesia Preprocedure Evaluation (Signed)
Anesthesia Evaluation  Patient identified by MRN, date of birth, ID band Patient awake    Reviewed: Allergy & Precautions, NPO status , Patient's Chart, lab work & pertinent test results  History of Anesthesia Complications Negative for: history of anesthetic complications  Airway Mallampati: III  TM Distance: >3 FB Neck ROM: Full    Dental no notable dental hx. (+) Chipped,    Pulmonary neg pulmonary ROS, neg sleep apnea, neg COPD,    breath sounds clear to auscultation- rhonchi (-) wheezing      Cardiovascular Exercise Tolerance: Good (-) hypertension(-) CAD, (-) Past MI, (-) Cardiac Stents and (-) CABG  Rhythm:Regular Rate:Normal - Systolic murmurs and - Diastolic murmurs    Neuro/Psych neg Seizures negative neurological ROS  negative psych ROS   GI/Hepatic negative GI ROS, Neg liver ROS,   Endo/Other  negative endocrine ROSneg diabetes  Renal/GU negative Renal ROS     Musculoskeletal negative musculoskeletal ROS (+)   Abdominal (+) + obese, Gravid abdomen  Peds  Hematology  (+) anemia ,   Anesthesia Other Findings Past Medical History: No date: Anemia   Reproductive/Obstetrics                             Lab Results  Component Value Date   WBC 12.7 (H) 12/08/2020   HGB 11.3 (L) 12/08/2020   HCT 35.5 (L) 12/08/2020   MCV 86.6 12/08/2020   PLT 248 12/08/2020    Anesthesia Physical  Anesthesia Plan  ASA: II  Anesthesia Plan: General   Post-op Pain Management:    Induction: Intravenous  PONV Risk Score and Plan: 2 and Ondansetron  Airway Management Planned: Oral ETT  Additional Equipment:   Intra-op Plan:   Post-operative Plan:   Informed Consent: I have reviewed the patients History and Physical, chart, labs and discussed the procedure including the risks, benefits and alternatives for the proposed anesthesia with the patient or authorized representative who has  indicated his/her understanding and acceptance.     Dental advisory given  Plan Discussed with: CRNA, Anesthesiologist and Surgeon  Anesthesia Plan Comments:         Anesthesia Quick Evaluation

## 2020-12-22 NOTE — Interval H&P Note (Signed)
History and Physical Interval Note:  12/22/2020 2:03 PM  Deanna Vaughn  has presented today for surgery, with the diagnosis of chronic calculous cholecystitis.  The various methods of treatment have been discussed with the patient and family. After consideration of risks, benefits and other options for treatment, the patient has consented to  Procedure(s): XI ROBOTIC ASSISTED LAPAROSCOPIC CHOLECYSTECTOMY (N/A) as a surgical intervention.  The patient's history has been reviewed, patient examined, no change in status, stable for surgery.  I have reviewed the patient's chart and labs.  Questions were answered to the patient's satisfaction.     Campbell Lerner

## 2020-12-24 LAB — SURGICAL PATHOLOGY

## 2021-01-06 ENCOUNTER — Ambulatory Visit (INDEPENDENT_AMBULATORY_CARE_PROVIDER_SITE_OTHER): Payer: BC Managed Care – PPO | Admitting: Surgery

## 2021-01-06 ENCOUNTER — Other Ambulatory Visit: Payer: Self-pay

## 2021-01-06 ENCOUNTER — Encounter: Payer: Self-pay | Admitting: Surgery

## 2021-01-06 VITALS — BP 106/72 | HR 68 | Temp 98.6°F | Ht 60.0 in | Wt 179.4 lb

## 2021-01-06 DIAGNOSIS — Z9049 Acquired absence of other specified parts of digestive tract: Secondary | ICD-10-CM

## 2021-01-06 NOTE — Progress Notes (Signed)
[facility] POST-OP OFFICE VISIT  01/06/2021  HPI: Deanna Vaughn is a 30 y.o. female 15 days s/p robotic cholecystectomy.  She denies having any nausea, vomiting, fevers or chills.  She reports normal bowel activity.  She denies pain.  Vital signs: BP 106/72   Pulse 68   Temp 98.6 F (37 C) (Oral)   Ht 5' (1.524 m)   Wt 179 lb 6.4 oz (81.4 kg)   LMP 12/24/2020   SpO2 98%   BMI 35.04 kg/m    Physical Exam: Constitutional: She appears well. Abdomen: Soft and nontender.  There is the expected thickening of the subcutaneous tissues at the periumbilical incision site which is the extraction site.  No evidence of fascial defect.  No evidence of infection or hematoma. Skin: Incisions are clean, dry and intact.  Pathology: GALLBLADDER; CHOLECYSTECTOMY:  - MILD CHRONIC CHOLECYSTITIS WITH CHOLELITHIASIS.  - NEGATIVE FOR DYSPLASIA AND MALIGNANCY.   Assessment/Plan: This is a 30 y.o. female 15 days s/p robotic cholecystectomy, progressing well.  Patient Active Problem List   Diagnosis Date Noted   CCC (chronic calculous cholecystitis) 12/16/2020   History of cesarean delivery 11/18/2015   S/P cesarean section 11/18/2015   Family history of anencephaly 05/06/2015    -May resume her job responsibilities, however may need to avoid any lifting more than 15 pounds through June 22.  And she may resume full activity.  Follow-up as needed.   Campbell Lerner M.D., FACS 01/06/2021, 11:45 AM

## 2021-01-06 NOTE — Patient Instructions (Signed)
If you have any concerns or questions, please feel free to call our office.    GENERAL POST-OPERATIVE PATIENT INSTRUCTIONS   WOUND CARE INSTRUCTIONS:  Keep a dry clean dressing on the wound if there is drainage. The initial bandage may be removed after 24 hours.  Once the wound has quit draining you may leave it open to air.  If clothing rubs against the wound or causes irritation and the wound is not draining you may cover it with a dry dressing during the daytime.  Try to keep the wound dry and avoid ointments on the wound unless directed to do so.  If the wound becomes bright red and painful or starts to drain infected material that is not clear, please contact your physician immediately.  If the wound is mildly pink and has a thick firm ridge underneath it, this is normal, and is referred to as a healing ridge.  This will resolve over the next 4-6 weeks.  BATHING: You may shower if you have been informed of this by your surgeon. However, Please do not submerge in a tub, hot tub, or pool until incisions are completely sealed or have been told by your surgeon that you may do so.  DIET:  You may eat any foods that you can tolerate.  It is a good idea to eat a high fiber diet and take in plenty of fluids to prevent constipation.  If you do become constipated you may want to take a mild laxative or take ducolax tablets on a daily basis until your bowel habits are regular.  Constipation can be very uncomfortable, along with straining, after recent surgery.  ACTIVITY:  You are encouraged to cough and deep breath or use your incentive spirometer if you were given one, every 15-30 minutes when awake.  This will help prevent respiratory complications and low grade fevers post-operatively if you had a general anesthetic.  You may want to hug a pillow when coughing and sneezing to add additional support to the surgical area, if you had abdominal or chest surgery, which will decrease pain during these times.   You are encouraged to walk and engage in light activity for the next two weeks.  You should not lift more than 15 pounds, until 01/19/2021 as it could put you at increased risk for complications.  Twenty pounds is roughly equivalent to a plastic bag of groceries. At that time- Listen to your body when lifting, if you have pain when lifting, stop and then try again in a few days. Soreness after doing exercises or activities of daily living is normal as you get back in to your normal routine.  MEDICATIONS:  Try to take narcotic medications and anti-inflammatory medications, such as tylenol, ibuprofen, naprosyn, etc., with food.  This will minimize stomach upset from the medication.  Should you develop nausea and vomiting from the pain medication, or develop a rash, please discontinue the medication and contact your physician.  You should not drive, make important decisions, or operate machinery when taking narcotic pain medication.  SUNBLOCK Use sun block to incision area over the next year if this area will be exposed to sun. This helps decrease scarring and will allow you avoid a permanent darkened area over your incision.     Gallbladder Eating Plan If you have a gallbladder condition, you may have trouble digesting fats. Eating a low-fat diet can help reduce your symptoms, and may be helpful before and after having surgery to remove your gallbladder (cholecystectomy).   Your health care provider may recommend that you work with a diet and nutrition specialist (dietitian) to help you reduce the amount of fat in your diet. What are tips for following this plan? General guidelines  Limit your fat intake to less than 30% of your total daily calories. If you eat around 1,800 calories each day, this is less than 60 grams (g) of fat per day.  Fat is an important part of a healthy diet. Eating a low-fat diet can make it hard to maintain a healthy body weight. Ask your dietitian how much fat, calories, and  other nutrients you need each day.  Eat small, frequent meals throughout the day instead of three large meals.  Drink at least 8-10 cups of fluid a day. Drink enough fluid to keep your urine clear or pale yellow.  Limit alcohol intake to no more than 1 drink a day for nonpregnant women and 2 drinks a day for men. One drink equals 12 oz of beer, 5 oz of wine, or 1 oz of hard liquor. Reading food labels  Check Nutrition Facts on food labels for the amount of fat per serving. Choose foods with less than 3 grams of fat per serving.   Shopping  Choose nonfat and low-fat healthy foods. Look for the words "nonfat," "low fat," or "fat free."  Avoid buying processed or prepackaged foods. Cooking  Cook using low-fat methods, such as baking, broiling, grilling, or boiling.  Cook with small amounts of healthy fats, such as olive oil, grapeseed oil, canola oil, or sunflower oil. What foods are recommended?  All fresh, frozen, or canned fruits and vegetables.  Whole grains.  Low-fat or non-fat (skim) milk and yogurt.  Lean meat, skinless poultry, fish, eggs, and beans.  Low-fat protein supplement powders or drinks.  Spices and herbs. What foods are not recommended?  High-fat foods. These include baked goods, fast food, fatty cuts of meat, ice cream, french toast, sweet rolls, pizza, cheese bread, foods covered with butter, creamy sauces, or cheese.  Fried foods. These include french fries, tempura, battered fish, breaded chicken, fried breads, and sweets.  Foods with strong odors.  Foods that cause bloating and gas. Summary  A low-fat diet can be helpful if you have a gallbladder condition, or before and after gallbladder surgery.  Limit your fat intake to less than 30% of your total daily calories. This is about 60 g of fat if you eat 1,800 calories each day.  Eat small, frequent meals throughout the day instead of three large meals. This information is not intended to replace  advice given to you by your health care provider. Make sure you discuss any questions you have with your health care provider. Document Revised: 03/04/2020 Document Reviewed: 03/04/2020 Elsevier Patient Education  2021 [employer]  

## 2021-05-17 ENCOUNTER — Ambulatory Visit (INDEPENDENT_AMBULATORY_CARE_PROVIDER_SITE_OTHER): Payer: Managed Care, Other (non HMO) | Admitting: Obstetrics & Gynecology

## 2021-05-17 ENCOUNTER — Other Ambulatory Visit: Payer: Self-pay

## 2021-05-17 ENCOUNTER — Encounter: Payer: Self-pay | Admitting: Obstetrics & Gynecology

## 2021-05-17 VITALS — BP 100/70 | Ht 60.0 in | Wt 183.0 lb

## 2021-05-17 DIAGNOSIS — R232 Flushing: Secondary | ICD-10-CM | POA: Diagnosis not present

## 2021-05-17 DIAGNOSIS — N926 Irregular menstruation, unspecified: Secondary | ICD-10-CM | POA: Diagnosis not present

## 2021-05-17 DIAGNOSIS — G43829 Menstrual migraine, not intractable, without status migrainosus: Secondary | ICD-10-CM

## 2021-05-17 MED ORDER — NORETHINDRONE 0.35 MG PO TABS: 1.0000 | ORAL_TABLET | Freq: Every day | ORAL | 11 refills | Status: DC

## 2021-05-17 NOTE — Patient Instructions (Signed)
Norethindrone acetate (hormone replacement) What is this medication? NORETHINDRONE ACETATE (nor eth IN drone AS e tate) is a female hormone. This medicine is used to treat endometriosis, uterine bleeding caused by abnormal hormone levels, and secondary amenorrhea. Secondary amenorrhea is when a woman stops getting menstrual periods due to low levels of certain female hormones. This medicine may be used for other purposes; ask your health care provider or pharmacist if you have questions. COMMON BRAND NAME(S): Aygestin What should I tell my care team before I take this medication? They need to know if you have any of these conditions: blood vessel disease or blood clots breast, cervical, or vaginal cancer diabetes heart disease kidney disease liver disease mental depression migraine seizures stroke vaginal bleeding an unusual or allergic reaction to norethindrone, other medicines, foods, dyes, or preservatives pregnant or trying to get pregnant breast-feeding How should I use this medication? Take this medicine by mouth with a glass of water. You may take this medicine with or without food. Follow the directions on the prescription label. Take this medicine at the same time each day. Do not take your medicine more often than directed. A patient information sheet will be given with each prescription and refill. Read this sheet carefully each time. The sheet may change frequently. Talk to your pediatrician regarding the use of this medicine in children. Special care may be needed. Overdosage: If you think you have taken too much of this medicine contact a poison control center or emergency room at once. NOTE: This medicine is only for you. Do not share this medicine with others. What if I miss a dose? If you miss a dose, take it as soon as you can. If it is almost time for your next dose, take only that dose. Do not take double or extra doses. What may interact with this medication? Do not  take this medicine with any of the following medications: amprenavir or fosamprenavir bosentan This medicine may also interact with the following medications: antibiotics or medicines for infections, especially rifampin, rifabutin, rifapentine, and griseofulvin, and possibly penicillins or tetracyclines aprepitant barbiturate medicines, such as phenobarbital carbamazepine felbamate modafinil oxcarbazepine phenytoin ritonavir or other medicines for HIV infection or AIDS St. John's wort topiramate This list may not describe all possible interactions. Give your health care provider a list of all the medicines, herbs, non-prescription drugs, or dietary supplements you use. Also tell them if you smoke, drink alcohol, or use illegal drugs. Some items may interact with your medicine. What should I watch for while using this medication? Visit your doctor or health care professional for regular checks on your progress. You will need a regular breast and pelvic exam and Pap smear while on this medicine. If you have any reason to think you are pregnant, stop taking this medicine right away and contact your doctor or health care professional. If you are taking this medicine for hormone related problems, it may take several cycles of use to see improvement in your condition. What side effects may I notice from receiving this medication? Side effects that you should report to your doctor or health care professional as soon as possible: breast tenderness or discharge pain in the abdomen, chest, groin or leg severe headache skin rash, itching, or hives sudden shortness of breath unusually weak or tired vision or speech problems yellowing of skin or eyes Side effects that usually do not require medical attention (report to your doctor or health care professional if they continue or are bothersome): changes  in sexual desire change in menstrual flow facial hair growth fluid retention and  swelling headache irritability nausea weight gain or loss This list may not describe all possible side effects. Call your doctor for medical advice about side effects. You may report side effects to FDA at 1-800-FDA-1088. Where should I keep my medication? Keep out of the reach of children. Store at room temperature between 15 and 30 degrees C (59 and 86 degrees F). Throw away any unused medicine after the expiration date. NOTE: This sheet is a summary. It may not cover all possible information. If you have questions about this medicine, talk to your doctor, pharmacist, or health care provider.  2022 Elsevier/Gold Standard (2008-02-10 14:38:36)

## 2021-05-17 NOTE — Progress Notes (Signed)
HPI:      [name] is a 30 y.o. [phone] who LMP was No LMP recorded., presents today for a problem visit.  She complains of  spotting most of month and also a heavy period at times; also has frequent headaches especially when period is heavy; also has feelings of cold but then of hot like a hot flash; also describes feeling a smell of ammonia in her skin or sweat at times; all this  began several months ago and its severity is described as  a significant distress to her.  [place] last year normal.  She tried Nuvaring but did not seem to help and did not like.   She is s/p BTL, no desire for fertility.  Also has freq fatigue.  PMHx: She  has a past medical history of Anemia. Also,  has a past surgical history that includes Cesarean section (08/01/2007); Cesarean section with bilateral tubal ligation (N/A, 11/18/2015); Cesarean section with bilateral tubal ligation (N/A, 08/08/2018); and Gallbladder surgery., family history includes Diabetes in her mother; Hypertension in her mother.,  reports that she has never smoked. She has never used smokeless tobacco. She reports that she does not drink alcohol and does not use drugs.  She  Current Outpatient Medications:    norethindrone (MICRONOR) 0.35 MG tablet, Take 1 tablet (0.35 mg total) by mouth daily., Disp: 28 tablet, Rfl: 11   acetaminophen (TYLENOL) 500 MG tablet, Take 1,000 mg by mouth every 6 (six) hours as needed for moderate pain or headache. (Patient not taking: Reported on 05/17/2021), Disp: , Rfl:   Also, has No Known Allergies.  Review of Systems  Constitutional:  Positive for malaise/fatigue. Negative for chills and fever.  HENT:  Negative for congestion, sinus pain and sore throat.   Eyes:  Negative for blurred vision and pain.  Respiratory:  Negative for cough and wheezing.   Cardiovascular:  Negative for chest pain and leg swelling.  Gastrointestinal:  Negative for abdominal pain, constipation, diarrhea, heartburn, nausea and  vomiting.  Genitourinary:  Negative for dysuria, frequency, hematuria and urgency.  Musculoskeletal:  Negative for back pain, joint pain, myalgias and neck pain.  Skin:  Negative for itching and rash.  Neurological:  Negative for dizziness, tremors and weakness.  Endo/Heme/Allergies:  Does not bruise/bleed easily.  Psychiatric/Behavioral:  Negative for depression. The patient is not nervous/anxious and does not have insomnia.    Objective: BP 100/70   Ht 5' (1.524 m)   Wt 183 lb (83 kg)   BMI 35.74 kg/m  Physical Exam Constitutional:      General: She is not in acute distress.    Appearance: She is well-developed.  Musculoskeletal:        General: Normal range of motion.  Neurological:     Mental Status: She is alert and oriented to person, place, and time.  Skin:    General: Skin is warm and dry.  Vitals reviewed.    ASSESSMENT/PLAN:    Problem List Items Addressed This Visit     Irregular menses        Likely hormonal    Prior US normal    Progesterone only methods discussed (esp w h/a history)    Desires POP for now; discussed pros and cons and how best to take them    Rx sent, info gv    Menstrual migraine without status migrainosus, not intractable       Hot flash not due to menopause    as well as  cold flashes and ammonia odor   Relevant Orders   TSH   Comp Met (CMET)   Plan Annual and PAP in 2 mos as well as for follow up   Barnett Applebaum, MD, [facility], [facility] 05/17/2021  9:28 AM

## 2021-05-18 LAB — COMPREHENSIVE METABOLIC PANEL
ALT: 4 IU/L (ref 0–32)
AST: 10 IU/L (ref 0–40)
Albumin/Globulin Ratio: 1.2 (ref 1.2–2.2)
Albumin: 3.8 g/dL (ref 3.8–4.8)
Alkaline Phosphatase: 34 IU/L — ABNORMAL LOW (ref 44–121)
BUN/Creatinine Ratio: 17 (ref 9–23)
BUN: 12 mg/dL (ref 6–20)
Bilirubin Total: <0.2 mg/dL (ref 0.0–1.2)
CO2: 22 mmol/L (ref 20–29)
Calcium: 9.2 mg/dL (ref 8.7–10.2)
Chloride: 104 mmol/L (ref 96–106)
Creatinine, Ser: 0.69 mg/dL (ref 0.57–1.00)
Globulin, Total: 3.1 g/dL (ref 1.5–4.5)
Glucose: 85 mg/dL (ref 70–99)
Potassium: 4.3 mmol/L (ref 3.5–5.2)
Sodium: 140 mmol/L (ref 134–144)
Total Protein: 6.9 g/dL (ref 6.0–8.5)
eGFR: 118 mL/min/{1.73_m2} (ref >59)

## 2021-05-18 LAB — TSH: TSH: 0.996 u[IU]/mL (ref 0.450–4.500)

## 2021-05-23 ENCOUNTER — Other Ambulatory Visit: Payer: Self-pay

## 2021-05-23 ENCOUNTER — Emergency Department
Admission: EM | Admit: 2021-05-23 | Discharge: 2021-05-23 | Disposition: A | Discharge status: Home / Self Care | Payer: Managed Care, Other (non HMO) | Attending: Emergency Medicine | Admitting: Emergency Medicine

## 2021-05-23 ENCOUNTER — Emergency Department: Payer: Managed Care, Other (non HMO)

## 2021-05-23 DIAGNOSIS — R209 Unspecified disturbances of skin sensation: Secondary | ICD-10-CM | POA: Insufficient documentation

## 2021-05-23 DIAGNOSIS — R0602 Shortness of breath: Secondary | ICD-10-CM | POA: Insufficient documentation

## 2021-05-23 DIAGNOSIS — R519 Headache, unspecified: Secondary | ICD-10-CM | POA: Diagnosis present

## 2021-05-23 DIAGNOSIS — Z20822 Contact with and (suspected) exposure to covid-19: Secondary | ICD-10-CM | POA: Insufficient documentation

## 2021-05-23 DIAGNOSIS — R42 Dizziness and giddiness: Secondary | ICD-10-CM | POA: Insufficient documentation

## 2021-05-23 DIAGNOSIS — R202 Paresthesia of skin: Secondary | ICD-10-CM

## 2021-05-23 LAB — BASIC METABOLIC PANEL
Anion gap: 7 (ref 5–15)
BUN: 14 mg/dL (ref 6–20)
CO2: 27 mmol/L (ref 22–32)
Calcium: 9 mg/dL (ref 8.9–10.3)
Chloride: 105 mmol/L (ref 98–111)
Creatinine, Ser: 0.73 mg/dL (ref 0.44–1.00)
GFR, Estimated: >60 mL/min (ref >60)
Glucose, Bld: 87 mg/dL (ref 70–99)
Potassium: 3.7 mmol/L (ref 3.5–5.1)
Sodium: 139 mmol/L (ref 135–145)

## 2021-05-23 LAB — URINALYSIS, ROUTINE W REFLEX MICROSCOPIC
Bilirubin Urine: NEGATIVE
Glucose, UA: NEGATIVE mg/dL
Hgb urine dipstick: NEGATIVE
Ketones, ur: NEGATIVE mg/dL
Leukocytes,Ua: NEGATIVE
Nitrite: NEGATIVE
Protein, ur: NEGATIVE mg/dL
Specific Gravity, Urine: 1.025 (ref 1.005–1.030)
pH: 6 (ref 5.0–8.0)

## 2021-05-23 LAB — PREGNANCY, URINE: Preg Test, Ur: NEGATIVE

## 2021-05-23 LAB — CBC
HCT: 31.4 % — ABNORMAL LOW (ref 36.0–46.0)
Hemoglobin: 10.2 g/dL — ABNORMAL LOW (ref 12.0–15.0)
MCH: 27.4 pg (ref 26.0–34.0)
MCHC: 32.5 g/dL (ref 30.0–36.0)
MCV: 84.4 fL (ref 80.0–100.0)
Platelets: 263 10*3/uL (ref 150–400)
RBC: 3.72 MIL/uL — ABNORMAL LOW (ref 3.87–5.11)
RDW: 13.1 % (ref 11.5–15.5)
WBC: 7.2 10*3/uL (ref 4.0–10.5)
nRBC: 0 % (ref 0.0–0.2)

## 2021-05-23 LAB — RESP PANEL BY RT-PCR (FLU A&B, COVID) ARPGX2
Influenza A by PCR: NEGATIVE
Influenza B by PCR: NEGATIVE
SARS Coronavirus 2 by RT PCR: NEGATIVE

## 2021-05-23 MED ORDER — PROCHLORPERAZINE EDISYLATE 10 MG/2ML IJ SOLN
10.0000 mg | Freq: Once | INTRAMUSCULAR | Status: AC
  Administered 2021-05-23: 10 mg via INTRAVENOUS
  Filled 2021-05-23: qty 2

## 2021-05-23 MED ORDER — LACTATED RINGERS IV BOLUS
1000.0000 mL | Freq: Once | INTRAVENOUS | Status: AC
  Administered 2021-05-23: 1000 mL via INTRAVENOUS

## 2021-05-23 MED ORDER — MAGNESIUM SULFATE 2 GM/50ML IV SOLN
2.0000 g | Freq: Once | INTRAVENOUS | Status: AC
  Administered 2021-05-23: 2 g via INTRAVENOUS
  Filled 2021-05-23: qty 50

## 2021-05-23 NOTE — ED Provider Notes (Signed)
[facility] Emergency Department Provider Note  ____________________________________________   Event Date/Time   First MD Initiated Contact with Patient 05/23/21 0930     (approximate)  I have reviewed the triage vital signs and the nursing notes.   HISTORY  Chief Complaint Shortness of Breath and Dizziness   HPI Deanna Vaughn is a 30 y.o. female with past medical history of anemia and cholecystectomy who presents for assessment of 3 to 4 weeks of some shortness, headache, lightheadedness, and tingling in the tips of the left fingers and toes.  She states that she felt particularly tired today at work for 15 to 20 minutes and was not sure if it got a pass out so went to get checked out.  She has not had any actual loss of consciousness or recent syncopal episodes.  She denies any tingling in the left side of her body.  She states the headache will last for few minutes few hours at a time and is not specifically in the morning afternoons.  No vision changes or vertigo.  She denies any fevers, chills, cough, chest pain, Donnell pain, vomiting, diarrhea, burning with urination, incontinence or numbness.  No injuries or falls.  No recent illicit drug use or EtOH use.  No history of migraines.  No other acute concerns at this time.         Past Medical History:  Diagnosis Date   Anemia     Patient Active Problem List   Diagnosis Date Noted   Status post laparoscopic cholecystectomy 01/06/2021   History of cesarean delivery 11/18/2015   S/P cesarean section 11/18/2015   Family history of anencephaly 05/06/2015    Past Surgical History:  Procedure Laterality Date   CESAREAN SECTION  08/01/2007   CESAREAN SECTION WITH BILATERAL TUBAL LIGATION N/A 11/18/2015   Procedure: CESAREAN SECTION ;  Surgeon: Nadara Mustard, MD;  Location: [facility] ORS;  Service: Obstetrics;  Laterality: N/A;   CESAREAN SECTION WITH BILATERAL TUBAL LIGATION N/A 08/08/2018   Procedure:  CESAREAN SECTION WITH BILATERAL TUBAL LIGATION;  Surgeon: Nadara Mustard, MD;  Location: [facility] ORS;  Service: Obstetrics;  Laterality: N/A;   GALLBLADDER SURGERY      Prior to Admission medications   Medication Sig Start Date End Date Taking? Authorizing Provider  acetaminophen (TYLENOL) 500 MG tablet Take 1,000 mg by mouth every 6 (six) hours as needed for moderate pain or headache. Patient not taking: Reported on 05/17/2021    [provider]  norethindrone (MICRONOR) 0.35 MG tablet Take 1 tablet (0.35 mg total) by mouth daily. 05/17/21   Nadara Mustard, MD    Allergies Patient has no known allergies.  Family History  Problem Relation Age of Onset   Hypertension Mother    Diabetes Mother     Social History Social History   Tobacco Use   Smoking status: Never   Smokeless tobacco: Never  Vaping Use   Vaping Use: Never used  Substance Use Topics   Alcohol use: No   Drug use: No    Review of Systems  Review of Systems  Constitutional:  Negative for chills and fever.  HENT:  Negative for sore throat.   Eyes:  Negative for pain.  Respiratory:  Positive for shortness of breath. Negative for cough and stridor.   Cardiovascular:  Negative for chest pain.  Gastrointestinal:  Negative for vomiting.  Genitourinary:  Negative for dysuria.  Musculoskeletal:  Negative for myalgias.  Skin:  Negative for rash.  Neurological:  Positive for dizziness, tingling (R fingers and toes) and headaches. Negative for seizures and loss of consciousness.  Psychiatric/Behavioral:  Negative for suicidal ideas.   All other systems reviewed and are negative.    ____________________________________________   PHYSICAL EXAM:  VITAL SIGNS: ED Triage Vitals  Enc Vitals Group     BP 05/23/21 0838 137/86     Pulse Rate 05/23/21 0838 77     Resp 05/23/21 0838 18     Temp 05/23/21 0838 98.8 F (37.1 C)     Temp Source 05/23/21 0838 Oral     SpO2 05/23/21 0838 100 %     Weight --       Height --      Head Circumference --      Peak Flow --      Pain Score 05/23/21 0812 0     Pain Loc --      Pain Edu? --      Excl. in GC? --    Vitals:   05/23/21 0958 05/23/21 1013  BP:  (!) 126/92  Pulse:  68  Resp:  16  Temp:    SpO2: 100% 98%   Physical Exam Vitals and nursing note reviewed.  Constitutional:      General: She is not in acute distress.    Appearance: She is well-developed.  HENT:     Head: Normocephalic and atraumatic.     Right Ear: External ear normal.     Left Ear: External ear normal.     Nose: Nose normal.     Mouth/Throat:     Mouth: Mucous membranes are moist.  Eyes:     Conjunctiva/sclera: Conjunctivae normal.  Cardiovascular:     Rate and Rhythm: Normal rate and regular rhythm.     Heart sounds: No murmur heard. Pulmonary:     Effort: Pulmonary effort is normal. No respiratory distress.     Breath sounds: Normal breath sounds.  Abdominal:     Palpations: Abdomen is soft.     Tenderness: There is no abdominal tenderness.  Musculoskeletal:     Cervical back: Neck supple.  Skin:    General: Skin is warm and dry.     Capillary Refill: Capillary refill takes less than 2 seconds.  Neurological:     Mental Status: She is alert and oriented to person, place, and time.  Psychiatric:        Mood and Affect: Mood normal.    Cranial nerves II through XII grossly intact.  No pronator drift.  No finger dysmetria.  Symmetric 5/5 strength of all extremities.  Sensation intact to light touch in all extremities.  Unremarkable unassisted gait.  ____________________________________________   LABS (all labs ordered are listed, but only abnormal results are displayed)  Labs Reviewed  CBC - Abnormal; Notable for the following components:      Result Value   RBC 3.72 (*)    Hemoglobin 10.2 (*)    HCT 31.4 (*)    All other components within normal limits  URINALYSIS, ROUTINE W REFLEX MICROSCOPIC - Abnormal; Notable for the following components:    Color, Urine YELLOW (*)    APPearance CLEAR (*)    All other components within normal limits  RESP PANEL BY RT-PCR (FLU A&B, COVID) ARPGX2  BASIC METABOLIC PANEL  PREGNANCY, URINE  POC URINE PREG, ED  CBG MONITORING, ED   ____________________________________________  EKG  Sinus rhythm with a ventricular rate of 73, normal axis, unremarkable intervals without evidence  of ischemia or significant arrhythmia. ____________________________________________  RADIOLOGY  ED MD interpretation: Chest x-ray shows no focal consolidation, effusion, edema, pneumothorax or any other clear acute intrathoracic process.  CT head has no evidence of edema, intracranial hemorrhage, mass-effect, subacute CVA or any other acute intracranial process.  Official radiology report(s): DG Chest 1 View  Result Date: 05/23/2021 CLINICAL DATA:  Shortness of breath EXAM: CHEST  1 VIEW COMPARISON:  None. FINDINGS: The heart size and mediastinal contours are within normal limits. Both lungs are clear. No pleural effusion. The visualized skeletal structures are unremarkable. IMPRESSION: No acute process in the chest. Electronically Signed   By: Guadlupe Spanish M.D.   On: 05/23/2021 08:36   CT HEAD WO CONTRAST ( )  Result Date: 05/23/2021 CLINICAL DATA:  Dizziness and shortness of breath EXAM: CT HEAD WITHOUT CONTRAST TECHNIQUE: Contiguous axial images were obtained from the base of the skull through the vertex without intravenous contrast. COMPARISON:  None. FINDINGS: Brain: No evidence of acute infarction, hemorrhage, hydrocephalus, extra-axial collection or mass lesion/mass effect. Vascular: No hyperdense vessel or unexpected calcification. Skull: Normal. Negative for fracture or focal lesion. Sinuses/Orbits: Left maxillary sinus mucocele with mucosal thickening of the ethmoid air cells. Mastoid air cells are clear. Orbits are unremarkable. Other: None. IMPRESSION: 1. No acute intracranial findings. 2. Left maxillary  sinus mucocele with mucosal thickening of the ethmoid air cells. Electronically Signed   By: Maudry Mayhew M.D.   On: 05/23/2021 11:07    ____________________________________________   PROCEDURES  Procedure(s) performed (including Critical Care):  Procedures   ____________________________________________   INITIAL IMPRESSION / ASSESSMENT AND PLAN / ED COURSE      Patient presents with above-stated history exam for assessment of 3 to 4 weeks of some tingling in her fingers in the right hand and right foot in the setting of headaches and some intermittent shortness of breath.  On arrival she is afebrile hemodynamically stable.  She has a nonfocal neurological exam with sensation intact throughout the bilateral right hemibody and no focal neurological deficits.  I suspect likely atypical migraine.  There are no focal deficits to suggest a CVA.  CT head shows no evidence of intracranial hemorrhage mass-effect edema or other acute process.  Patient states he symptoms of shortness of breath with these episodes she states she does not currently have any shortness of breath never low suspicion for PE as she is not tachycardic, tachypneic, hypoxic and is denying any chest pain or cough or other clear symptoms to suggest this at this time.  Chest x-ray shows no focal consolidation, effusion, edema, pneumothorax or any other clear acute intrathoracic process.  EKG without evidence of acute ischemia or arrhythmia.  BMP shows unremarkable electrolytes and kidney function.  Pregnancy test is negative.  CBC shows no leukocytosis or acute anemia as it seems patient's baseline hemoglobin is between 7-11.  COVID influenza PCR is negative.  Patient treated with below noted headache cocktail and on reassessment states he is feeling much better.  She has no headache, paresthesias improved.  Given stable vitals with reassuring exam and work-up I think she is stable for discharge with close outpatient follow-up.   Impression is likely atypical migraine.  Discharged stable condition.  Strict return precautions advised and discussed.     ____________________________________________   FINAL CLINICAL IMPRESSION(S) / ED DIAGNOSES  Final diagnoses:  Acute nonintractable headache, unspecified headache type  Paresthesia  Lightheaded    Medications  lactated ringers bolus 1,000 mL (1,000 mLs Intravenous New Bag/Given 05/23/21  1007)  prochlorperazine (COMPAZINE) injection 10 mg (10 mg Intravenous Given 05/23/21 1004)  magnesium sulfate IVPB 2 g 50 mL (0 g Intravenous Stopped 05/23/21 1110)     ED Discharge Orders     None        Note:  This document was prepared using Dragon voice recognition software and may include unintentional dictation errors.    Gilles Chiquito, MD 05/23/21 1116

## 2021-05-23 NOTE — ED Triage Notes (Signed)
Pt comes with c/o SOB and dizziness for about 3 weeks. Pt stats today she was at work and got lightheaded so she decided to come get checked out.

## 2021-05-23 NOTE — ED Notes (Signed)
Pt reports tingling in rt side fingers and toes, intermittently past month sometimes w/H/A but not always.

## 2021-06-13 ENCOUNTER — Encounter: Payer: Self-pay | Admitting: Family Medicine

## 2021-06-13 ENCOUNTER — Other Ambulatory Visit: Payer: Self-pay

## 2021-06-13 ENCOUNTER — Ambulatory Visit: Payer: Managed Care, Other (non HMO) | Admitting: Family Medicine

## 2021-06-13 VITALS — BP 108/78 | HR 76 | Temp 98.5°F | Ht 60.0 in | Wt 180.0 lb

## 2021-06-13 DIAGNOSIS — J341 Cyst and mucocele of nose and nasal sinus: Secondary | ICD-10-CM

## 2021-06-13 DIAGNOSIS — G44229 Chronic tension-type headache, not intractable: Secondary | ICD-10-CM | POA: Diagnosis not present

## 2021-06-13 MED ORDER — DICLOFENAC SODIUM 75 MG PO TBEC: 75.0000 mg | DELAYED_RELEASE_TABLET | Freq: Two times a day (BID) | ORAL | 1 refills | Status: DC | PRN

## 2021-06-13 MED ORDER — FLUTICASONE PROPIONATE 50 MCG/ACT NA SUSP: 2.0000 | Freq: Every day | NASAL | 1 refills | Status: DC

## 2021-06-13 NOTE — Patient Instructions (Signed)
-   Start fluticasone propionate (Flonase) 2 sprays in each nostril daily until follow-up - Start OTC antihistamine (Claritin, Zyrtec, Allegra, etc.) once daily until follow-up - Can dose diclofenac 75 mg twice daily on an as-needed basis for headache (take with food) - Monitor headache frequency, timing of onset, associated activities, etc. - Return for follow-up in 4 weeks, contact us for questions between now and then

## 2021-06-13 NOTE — Assessment & Plan Note (Signed)
Patient with stated history of roughly 1 year of chronic headache, headache pattern described as bilateral temporal region, tight, does note intermittent paresthesias in the right upper extremity and right lower extremity, denies any visual changes, does note improvement with OTC medications, Bright light avoidance.  She had recent presentation to ER for the same where CT scan was obtained, significant for left maxillary sinus mucous cyst.  Her neurologic examination today reveals benign cranial nerve findings, cardiac findings reveal positive S1-S2, regular rate and rhythm, no additional heart sounds, no JVD or carotid bruits noted, clear to auscultation throughout lung fields.  She does have mild paraspinal tenderness symmetrically, negative Spurling's testing bilaterally had full cervical range of motion that is painless noted.  Given her CT findings, stated history, and physical examination today, have advised concomitant treatment of sinus in addition to as needed diclofenac for headache symptoms.  Pending interval change at her return, if suboptimal, can discuss imaging of the cervical spine, home-based versus formal rehab, referral to neurology.  If improved/improving, optimization with home-based rehab and adjunct medications to be reviewed.

## 2021-06-13 NOTE — Assessment & Plan Note (Signed)
Incidentally noted on recent CT scan in the setting of chronic headache.  This may be a contributing factor in her headache etiology.  Physical examination does reveal swollen and erythematous turbinates, right greater than left on today's examination, sinuses are nontender, oropharynx, tympanic membrane's, canals bilaterally are otherwise benign.  I have advised treatment to ascertain the contribution of her sinus findings in regards to headache.  She is to initiate scheduled daily OTC antihistamine, fluticasone propionate, and maintain follow-up in 4 weeks time.  If adequately improved, referral to ENT versus continued regimen to be considered.

## 2021-06-13 NOTE — Progress Notes (Signed)
Primary Care / Sports Medicine Office Visit  Patient Information:  Patient ID: Deanna Vaughn, female DOB: 1990/09/21 Age: 30 y.o. MRN: [id number]   Deanna Vaughn is a pleasant 30 y.o. female presenting with the following:  Chief Complaint  Patient presents with   New Patient (Initial Visit)   Establish Care   Headache    Seen in the ER 05/23/21 for acute non-intractable headache; right-sided numbness and tingling, nausea, and photophobia associated; headaches intermittent x1 year, but have become more severe; located bilateral temporal and occipital; taking Tylenol and Excedrin with minimal relief; CT head obtained at ER with no significant findings    Review of Systems pertinent details above   Patient Active Problem List   Diagnosis Date Noted   Chronic tension-type headache, not intractable 06/13/2021   Mucocele of maxillary sinus 06/13/2021   Status post laparoscopic cholecystectomy 01/06/2021   History of cesarean delivery 11/18/2015   S/P cesarean section 11/18/2015   Family history of anencephaly 05/06/2015   Past Medical History:  Diagnosis Date   Anemia    Outpatient Encounter Medications as of 06/13/2021  Medication Sig   acetaminophen (TYLENOL) 500 MG tablet Take 1,000 mg by mouth every 6 (six) hours as needed for moderate pain or headache.   aspirin-acetaminophen-caffeine (EXCEDRIN MIGRAINE) 250-250-65 MG tablet Take 2 tablets by mouth every 6 (six) hours as needed for headache.   diclofenac (VOLTAREN) 75 MG EC tablet Take 1 tablet (75 mg total) by mouth 2 (two) times daily as needed.   fluticasone (FLONASE) 50 MCG/ACT nasal spray Place 2 sprays into both nostrils daily.   norethindrone (MICRONOR) 0.35 MG tablet Take 1 tablet (0.35 mg total) by mouth daily.   No facility-administered encounter medications on file as of 06/13/2021.   Past Surgical History:  Procedure Laterality Date   CESAREAN SECTION  08/01/2007   CESAREAN SECTION WITH BILATERAL TUBAL  LIGATION N/A 11/18/2015   Procedure: CESAREAN SECTION ;  Surgeon: Nadara Mustard, MD;  Location: [facility] ORS;  Service: Obstetrics;  Laterality: N/A;   CESAREAN SECTION WITH BILATERAL TUBAL LIGATION N/A 08/08/2018   Procedure: CESAREAN SECTION WITH BILATERAL TUBAL LIGATION;  Surgeon: Nadara Mustard, MD;  Location: [facility] ORS;  Service: Obstetrics;  Laterality: N/A;   CHOLECYSTECTOMY  2022    Vitals:   06/13/21 1328  BP: 108/78  Pulse: 76  Temp: 98.5 F (36.9 C)  SpO2: 99%   Vitals:   06/13/21 1328  Weight: 180 lb (81.6 kg)  Height: 5' (1.524 m)   Body mass index is 35.15 kg/m.  DG Chest 1 View  Result Date: 05/23/2021 CLINICAL DATA:  Shortness of breath EXAM: CHEST  1 VIEW COMPARISON:  None. FINDINGS: The heart size and mediastinal contours are within normal limits. Both lungs are clear. No pleural effusion. The visualized skeletal structures are unremarkable. IMPRESSION: No acute process in the chest. Electronically Signed   By: Guadlupe Spanish M.D.   On: 05/23/2021 08:36   CT HEAD WO CONTRAST ( )  Result Date: 05/23/2021 CLINICAL DATA:  Dizziness and shortness of breath EXAM: CT HEAD WITHOUT CONTRAST TECHNIQUE: Contiguous axial images were obtained from the base of the skull through the vertex without intravenous contrast. COMPARISON:  None. FINDINGS: Brain: No evidence of acute infarction, hemorrhage, hydrocephalus, extra-axial collection or mass lesion/mass effect. Vascular: No hyperdense vessel or unexpected calcification. Skull: Normal. Negative for fracture or focal lesion. Sinuses/Orbits: Left maxillary sinus mucocele with mucosal thickening of the ethmoid air cells. Mastoid air  cells are clear. Orbits are unremarkable. Other: None. IMPRESSION: 1. No acute intracranial findings. 2. Left maxillary sinus mucocele with mucosal thickening of the ethmoid air cells. Electronically Signed   By: Maudry Mayhew M.D.   On: 05/23/2021 11:07     Independent interpretation of notes and  tests performed by another provider:   None  Procedures performed:   None  Pertinent History, Exam, Impression, and Recommendations:   Mucocele of maxillary sinus Incidentally noted on recent CT scan in the setting of chronic headache.  This may be a contributing factor in her headache etiology.  Physical examination does reveal swollen and erythematous turbinates, right greater than left on today's examination, sinuses are nontender, oropharynx, tympanic membrane's, canals bilaterally are otherwise benign.  I have advised treatment to ascertain the contribution of her sinus findings in regards to headache.  She is to initiate scheduled daily OTC antihistamine, fluticasone propionate, and maintain follow-up in 4 weeks time.  If adequately improved, referral to ENT versus continued regimen to be considered.  Chronic tension-type headache, not intractable Patient with stated history of roughly 1 year of chronic headache, headache pattern described as bilateral temporal region, tight, does note intermittent paresthesias in the right upper extremity and right lower extremity, denies any visual changes, does note improvement with OTC medications, Bright light avoidance.  She had recent presentation to ER for the same where CT scan was obtained, significant for left maxillary sinus mucous cyst.  Her neurologic examination today reveals benign cranial nerve findings, cardiac findings reveal positive S1-S2, regular rate and rhythm, no additional heart sounds, no JVD or carotid bruits noted, clear to auscultation throughout lung fields.  She does have mild paraspinal tenderness symmetrically, negative Spurling's testing bilaterally had full cervical range of motion that is painless noted.  Given her CT findings, stated history, and physical examination today, have advised concomitant treatment of sinus in addition to as needed diclofenac for headache symptoms.  Pending interval change at her return, if  suboptimal, can discuss imaging of the cervical spine, home-based versus formal rehab, referral to neurology.  If improved/improving, optimization with home-based rehab and adjunct medications to be reviewed.   Orders & Medications Meds ordered this encounter  Medications   fluticasone (FLONASE) 50 MCG/ACT nasal spray    Sig: Place 2 sprays into both nostrils daily.    Dispense:  16 g    Refill:  1   diclofenac (VOLTAREN) 75 MG EC tablet    Sig: Take 1 tablet (75 mg total) by mouth 2 (two) times daily as needed.    Dispense:  60 tablet    Refill:  1   No orders of the defined types were placed in this encounter.    Return in about 4 weeks (around 07/11/2021).     Jerrol Banana, MD   Primary Care Sports Medicine [facility]

## 2021-07-02 ENCOUNTER — Telehealth: Payer: Managed Care, Other (non HMO) | Admitting: Emergency Medicine

## 2021-07-02 DIAGNOSIS — N898 Other specified noninflammatory disorders of vagina: Secondary | ICD-10-CM

## 2021-07-02 MED ORDER — METRONIDAZOLE 500 MG PO TABS: 500.0000 mg | ORAL_TABLET | Freq: Two times a day (BID) | ORAL | 0 refills | Status: DC

## 2021-07-02 NOTE — Progress Notes (Signed)
I have spent 5 minutes in review of e-visit questionnaire, review and updating patient chart, medical decision making and response to patient.   Blayklee Mable, PA-C    

## 2021-07-02 NOTE — Progress Notes (Signed)

## 2021-07-06 ENCOUNTER — Other Ambulatory Visit: Payer: Self-pay | Admitting: Family Medicine

## 2021-07-06 DIAGNOSIS — J341 Cyst and mucocele of nose and nasal sinus: Secondary | ICD-10-CM

## 2021-07-06 NOTE — Telephone Encounter (Signed)
Requested Prescriptions  Pending Prescriptions Disp Refills  . fluticasone (FLONASE) 50 MCG/ACT nasal spray [Pharmacy Med Name: FLUTICASONE PROP 50 MCG SPRAY] 16 mL 1    Sig: SPRAY 2 SPRAYS INTO EACH NOSTRIL EVERY DAY     Ear, Nose, and Throat: Nasal Preparations - Corticosteroids Passed - 07/06/2021 10:34 AM      Passed - Valid encounter within last 12 months    Recent Outpatient Visits          3 weeks ago Mucocele of maxillary sinus   [facility] Jerrol Banana, MD      Future Appointments            In 1 week Ashley Royalty, Ocie Bob, MD [facility], [facility]

## 2021-07-11 ENCOUNTER — Ambulatory Visit: Payer: Medicaid Other | Admitting: Family Medicine

## 2021-07-18 ENCOUNTER — Encounter: Payer: Self-pay | Admitting: Family Medicine

## 2021-07-18 ENCOUNTER — Other Ambulatory Visit: Payer: Self-pay

## 2021-07-18 ENCOUNTER — Ambulatory Visit (INDEPENDENT_AMBULATORY_CARE_PROVIDER_SITE_OTHER): Payer: Managed Care, Other (non HMO) | Admitting: Family Medicine

## 2021-07-18 VITALS — BP 102/78 | HR 79 | Ht 60.0 in | Wt 180.0 lb

## 2021-07-18 DIAGNOSIS — J341 Cyst and mucocele of nose and nasal sinus: Secondary | ICD-10-CM

## 2021-07-18 DIAGNOSIS — F411 Generalized anxiety disorder: Secondary | ICD-10-CM | POA: Diagnosis not present

## 2021-07-18 DIAGNOSIS — B353 Tinea pedis: Secondary | ICD-10-CM | POA: Diagnosis not present

## 2021-07-18 DIAGNOSIS — J309 Allergic rhinitis, unspecified: Secondary | ICD-10-CM | POA: Insufficient documentation

## 2021-07-18 DIAGNOSIS — G44229 Chronic tension-type headache, not intractable: Secondary | ICD-10-CM

## 2021-07-18 MED ORDER — CITALOPRAM HYDROBROMIDE 20 MG PO TABS: 20.0000 mg | ORAL_TABLET | Freq: Every day | ORAL | 2 refills | Status: DC

## 2021-07-18 NOTE — Assessment & Plan Note (Signed)
See additional assessment(s) for plan details. 

## 2021-07-18 NOTE — Patient Instructions (Addendum)
-   Can discontinue Flonase, if headache symptoms recur, restart on a daily basis - Continue diclofenac on an as-needed basis for headache related pain - Start citalopram (Celexa) 20 mg daily - Review information on nonmedication forms of management - Pick up and start over-the-counter Lamisil / Tinactin for foot symptoms (tinea pedis), please take photograph of foot for symptom return - Return for follow-up in 4 weeks, reach out for questions between now and then

## 2021-07-18 NOTE — Assessment & Plan Note (Signed)
Depression screen [facility] 2/9 07/18/2021 06/13/2021  Decreased Interest 0 0  Down, Depressed, Hopeless 0 0  PHQ - 2 Score 0 0  Altered sleeping 0 1  Tired, decreased energy 2 0  Change in appetite 0 1  Feeling bad or failure about yourself  0 0  Trouble concentrating 0 0  Moving slowly or fidgety/restless 0 0  Suicidal thoughts 0 0  PHQ-9 Score 2 2  Difficult doing work/chores Not difficult at all Not difficult at all   GAD 7 : Generalized Anxiety Score 07/18/2021 06/13/2021  Nervous, Anxious, on Edge 0 0  Control/stop worrying 1 0  Worry too much - different things 1 0  Trouble relaxing 0 0  Restless 0 0  Easily annoyed or irritable 0 0  Afraid - awful might happen 0 0  Total GAD 7 Score 2 0  Anxiety Difficulty Not difficult at all Not difficult at all   Patient with reassuring PHQ and GAD scores, that being said, her presentation involved concern over hypervigilance at work, feelings that she is being scrutinized while at work, hyperhidrosis and insecurity over body odor though she denies appreciating any of the same or has been told this otherwise.  Denies any changes in sleep pattern, topical OTC products other than switching to clinical strength Dove antiperspirant.  When differential diagnosis of her concerns are brought forward, upon discussion of anxiety, she immediately becomes tearful.  The nature of this concern was described in detail as well as pharmacologic and nonpharmacologic treatments.  At this stage she is amenable to Celexa 20 mg daily, close follow-up in 4 weeks, adjunct methods for anxiety reviewed and provided to patient via MyChart.  Pending symptoms at return, can decide on further titration or transition of medications.  Can also further discuss referral to psychiatry if appropriate.

## 2021-07-18 NOTE — Assessment & Plan Note (Signed)
Noted near resolution with as needed dosing of diclofenac not being required for greater than 2 weeks.  Strong correlation considered with comorbid allergic rhinitis/incidentally noted maxillary sinus cyst.  SNF, metabolic diclofenac, as-needed basis, if headache pattern or change or frequency increases, further evaluation and management to be coordinated.

## 2021-07-18 NOTE — Assessment & Plan Note (Signed)
Incidentally noted during outside CT headache work-up, has since responded very well to intranasal steroid therapy with associated near resolution of headaches.  Can be considered a sequela of chronic allergic rhinitis.  Her examination findings today reveal nontender sinuses throughout, nonerythematous and nonswollen turbinates bilaterally, oropharynx and tympanic membranes/canals benign.  Given her excellent clinical response and findings today, I have advised discontinuation of Flonase, she is to restart this with a very low threshold for any recurrence of headache symptoms.  At which time I have advised her to continue is on a regular basis.  Otherwise, she can utilize [employer].

## 2021-07-18 NOTE — Progress Notes (Signed)
Primary Care / Sports Medicine Office Visit  Patient Information:  Patient ID: Deanna Vaughn, female DOB: Jun 19, 1991 Age: 30 y.o. MRN: [id number]   Deanna Vaughn is a pleasant 31 y.o. female presenting with the following:  Chief Complaint  Patient presents with   Follow-up   Headache    Better, taking Flonase daily and diclofenac 75 mg bid prn; last headache about 2 weeks ago    Patient Active Problem List   Diagnosis Date Noted   Generalized anxiety disorder 07/18/2021   Tinea pedis of right foot 07/18/2021   Allergic rhinitis 07/18/2021   Chronic tension-type headache, not intractable 06/13/2021   Mucocele of maxillary sinus 06/13/2021   Status post laparoscopic cholecystectomy 01/06/2021   History of cesarean delivery 11/18/2015   S/P cesarean section 11/18/2015   Family history of anencephaly 05/06/2015    Vitals:   07/18/21 0905  BP: 102/78  Pulse: 79  SpO2: 99%   Vitals:   07/18/21 0905  Weight: 180 lb (81.6 kg)  Height: 5' (1.524 m)   Body mass index is 35.15 kg/m.  No results found.   Independent interpretation of notes and tests performed by another provider:   None  Procedures performed:   None  Pertinent History, Exam, Impression, and Recommendations:   Mucocele of maxillary sinus Incidentally noted during outside CT headache work-up, has since responded very well to intranasal steroid therapy with associated near resolution of headaches.  Can be considered a sequela of chronic allergic rhinitis.  Her examination findings today reveal nontender sinuses throughout, nonerythematous and nonswollen turbinates bilaterally, oropharynx and tympanic membranes/canals benign.  Given her excellent clinical response and findings today, I have advised discontinuation of Flonase, she is to restart this with a very low threshold for any recurrence of headache symptoms.  At which time I have advised her to continue is on a regular basis.  Otherwise, she can  utilize [employer].  Allergic rhinitis See additional assessment(s) for plan details.  Chronic tension-type headache, not intractable Noted near resolution with as needed dosing of diclofenac not being required for greater than 2 weeks.  Strong correlation considered with comorbid allergic rhinitis/incidentally noted maxillary sinus cyst.  SNF, metabolic diclofenac, as-needed basis, if headache pattern or change or frequency increases, further evaluation and management to be coordinated.  Tinea pedis of right foot Patient reports greater than 1 month history of interdigit first and second right foot, from what she describes, vesicular black lesions that she has been popping herself with leakage of clear fluid.  She has noted mild involvement to the plantar forefoot tracking along the region of the first/second interdigit space.  Denies any pain associated with this other than mild "bruise-like" sporadic discomfort.  Denies any similar symptoms or lesions in the past, no contralateral findings reported.  Physical examination reveals no evidence of interdigit skin abnormalities, nontender throughout with full range of motion, there is mild dryness of skin at the forefoot, no evidence of plantar wart, other skin abnormalities.  Her stated history and findings raise concern for tinea pedis, as such I have advised patient to utilize OTC medications if symptoms were to recur, additionally she was encouraged to take photographs of the lesions for further evaluation.  We will follow-up on an as-needed basis for this issue.  Generalized anxiety disorder Depression screen [facility] 2/9 07/18/2021 06/13/2021  Decreased Interest 0 0  Down, Depressed, Hopeless 0 0  PHQ - 2 Score 0 0  Altered sleeping 0 1  Tired, decreased energy 2 0  Change in appetite 0 1  Feeling bad or failure about yourself  0 0  Trouble concentrating 0 0  Moving slowly or fidgety/restless 0 0  Suicidal thoughts 0 0  PHQ-9 Score  2 2  Difficult doing work/chores Not difficult at all Not difficult at all   GAD 7 : Generalized Anxiety Score 07/18/2021 06/13/2021  Nervous, Anxious, on Edge 0 0  Control/stop worrying 1 0  Worry too much - different things 1 0  Trouble relaxing 0 0  Restless 0 0  Easily annoyed or irritable 0 0  Afraid - awful might happen 0 0  Total GAD 7 Score 2 0  Anxiety Difficulty Not difficult at all Not difficult at all   Patient with reassuring PHQ and GAD scores, that being said, her presentation involved concern over hypervigilance at work, feelings that she is being scrutinized while at work, hyperhidrosis and insecurity over body odor though she denies appreciating any of the same or has been told this otherwise.  Denies any changes in sleep pattern, topical OTC products other than switching to clinical strength Dove antiperspirant.  When differential diagnosis of her concerns are brought forward, upon discussion of anxiety, she immediately becomes tearful.  The nature of this concern was described in detail as well as pharmacologic and nonpharmacologic treatments.  At this stage she is amenable to Celexa 20 mg daily, close follow-up in 4 weeks, adjunct methods for anxiety reviewed and provided to patient via MyChart.  Pending symptoms at return, can decide on further titration or transition of medications.  Can also further discuss referral to psychiatry if appropriate.   I provided a total time of 63 minutes including both face-to-face and non-face-to-face time on 07/18/2021 inclusive of time utilized for medical chart review, information gathering, care coordination with staff, and documentation completion. Specifically we went over the nature of anxiety, thoughts on etiologies, relation to recent life stressors (new job) and treatments. Patient's questions were answered at length.   Orders & Medications No orders of the defined types were placed in this encounter.  No orders of the defined  types were placed in this encounter.    Return in about 4 weeks (around 08/15/2021).     Jerrol Banana, MD   Primary Care Sports Medicine [facility]

## 2021-07-18 NOTE — Assessment & Plan Note (Signed)
Patient reports greater than 1 month history of interdigit first and second right foot, from what she describes, vesicular black lesions that she has been popping herself with leakage of clear fluid.  She has noted mild involvement to the plantar forefoot tracking along the region of the first/second interdigit space.  Denies any pain associated with this other than mild "bruise-like" sporadic discomfort.  Denies any similar symptoms or lesions in the past, no contralateral findings reported.  Physical examination reveals no evidence of interdigit skin abnormalities, nontender throughout with full range of motion, there is mild dryness of skin at the forefoot, no evidence of plantar wart, other skin abnormalities.  Her stated history and findings raise concern for tinea pedis, as such I have advised patient to utilize OTC medications if symptoms were to recur, additionally she was encouraged to take photographs of the lesions for further evaluation.  We will follow-up on an as-needed basis for this issue.

## 2021-07-19 ENCOUNTER — Ambulatory Visit (INDEPENDENT_AMBULATORY_CARE_PROVIDER_SITE_OTHER): Payer: Managed Care, Other (non HMO) | Admitting: Obstetrics & Gynecology

## 2021-07-19 ENCOUNTER — Encounter: Payer: Self-pay | Admitting: Obstetrics & Gynecology

## 2021-07-19 ENCOUNTER — Other Ambulatory Visit (HOSPITAL_COMMUNITY)
Admission: RE | Admit: 2021-07-19 | Discharge: 2021-07-19 | Disposition: A | Discharge status: Home / Self Care | Payer: Medicaid Other | Source: Ambulatory Visit | Attending: Obstetrics & Gynecology | Admitting: Obstetrics & Gynecology

## 2021-07-19 VITALS — BP 120/80 | Ht 60.0 in | Wt 183.0 lb

## 2021-07-19 DIAGNOSIS — N6323 Unspecified lump in the left breast, lower outer quadrant: Secondary | ICD-10-CM

## 2021-07-19 DIAGNOSIS — N926 Irregular menstruation, unspecified: Secondary | ICD-10-CM

## 2021-07-19 DIAGNOSIS — Z01419 Encounter for gynecological examination (general) (routine) without abnormal findings: Secondary | ICD-10-CM

## 2021-07-19 NOTE — Progress Notes (Signed)
HPI:      Deanna Vaughn is a 30 y.o. [phone] who LMP was No LMP recorded. (Menstrual status: Oral contraceptives)., she presents today for her annual examination. The patient has no complaints today other than continued irreg bleeding, about 3 weeks each month she has bleeding, despite prior Nuvaring and current OCP usage.  No desire for pregnancy. The patient is sexually active. Her last pap: approximate date 2021 and was normal and last mammogram: patient has never had a mammogram. The patient does perform self breast exams.  There is no notable family history of breast or ovarian cancer in her family.  The patient has regular exercise: yes.  The patient denies current symptoms of depression.    GYN History: Contraception: tubal ligation  PMHx: Past Medical History:  Diagnosis Date   Anemia    Past Surgical History:  Procedure Laterality Date   CESAREAN SECTION  08/01/2007   CESAREAN SECTION WITH BILATERAL TUBAL LIGATION N/A 11/18/2015   Procedure: CESAREAN SECTION ;  Surgeon: Gae Dry, MD;  Location: [facility] ORS;  Service: Obstetrics;  Laterality: N/A;   CESAREAN SECTION WITH BILATERAL TUBAL LIGATION N/A 08/08/2018   Procedure: CESAREAN SECTION WITH BILATERAL TUBAL LIGATION;  Surgeon: Gae Dry, MD;  Location: [facility] ORS;  Service: Obstetrics;  Laterality: N/A;   CHOLECYSTECTOMY  2022   Family History  Problem Relation Age of Onset   Hypertension Mother    Diabetes Mother    Social History   Tobacco Use   Smoking status: Never   Smokeless tobacco: Never  Vaping Use   Vaping Use: Never used  Substance Use Topics   Alcohol use: Not Currently   Drug use: Never    Current Outpatient Medications:    norethindrone (MICRONOR) 0.35 MG tablet, Take 1 tablet (0.35 mg total) by mouth daily., Disp: 28 tablet, Rfl: 11   citalopram (CELEXA) 20 MG tablet, Take 1 tablet (20 mg total) by mouth daily. (Patient not taking: Reported on 07/19/2021), Disp: 30 tablet, Rfl: 2    diclofenac (VOLTAREN) 75 MG EC tablet, Take 1 tablet (75 mg total) by mouth 2 (two) times daily as needed. (Patient not taking: Reported on 07/19/2021), Disp: 60 tablet, Rfl: 1   fluticasone (FLONASE) 50 MCG/ACT nasal spray, SPRAY 2 SPRAYS INTO EACH NOSTRIL EVERY DAY (Patient not taking: Reported on 07/19/2021), Disp: 16 mL, Rfl: 1 Allergies: Patient has no known allergies.  Review of Systems  Constitutional:  Negative for chills, fever and malaise/fatigue.  HENT:  Negative for congestion, sinus pain and sore throat.   Eyes:  Negative for blurred vision and pain.  Respiratory:  Negative for cough and wheezing.   Cardiovascular:  Negative for chest pain and leg swelling.  Gastrointestinal:  Negative for abdominal pain, constipation, diarrhea, heartburn, nausea and vomiting.  Genitourinary:  Negative for dysuria, frequency, hematuria and urgency.  Musculoskeletal:  Negative for back pain, joint pain, myalgias and neck pain.  Skin:  Negative for itching and rash.  Neurological:  Negative for dizziness, tremors and weakness.  Endo/Heme/Allergies:  Does not bruise/bleed easily.  Psychiatric/Behavioral:  Negative for depression. The patient is not nervous/anxious and does not have insomnia.    Objective: BP 120/80    Ht 5' (1.524 m)    Wt 183 lb (83 kg)    BMI 35.74 kg/m   [facility] Weights   07/19/21 0855  Weight: 183 lb (83 kg)   Body mass index is 35.74 kg/m. Physical Exam Constitutional:  General: She is not in acute distress.    Appearance: She is well-developed.  Genitourinary:     Bladder, rectum and urethral meatus normal.     No lesions in the vagina.     Genitourinary Comments: Left OLQ T and smal FCC vs lump <1 cm     Right Labia: No rash, tenderness or lesions.    Left Labia: No tenderness, lesions or rash.    No vaginal bleeding.      Right Adnexa: not tender and no mass present.    Left Adnexa: not tender and no mass present.    No cervical motion tenderness,  friability, lesion or polyp.     Cervical exam comments: High cervix, long spec needed.     Uterus is not enlarged.     No uterine mass detected.    Uterus is anteverted.     Pelvic exam was performed with patient in the lithotomy position.  Breasts:    Right: No mass, skin change or tenderness.     Left: Mass and tenderness present. No skin change.  HENT:     Head: Normocephalic and atraumatic. No laceration.     Right Ear: Hearing normal.     Left Ear: Hearing normal.     Mouth/Throat:     Pharynx: Uvula midline.  Eyes:     Pupils: Pupils are equal, round, and reactive to light.  Neck:     Thyroid: No thyromegaly.  Cardiovascular:     Rate and Rhythm: Normal rate and regular rhythm.     Heart sounds: No murmur heard.   No friction rub. No gallop.  Pulmonary:     Effort: Pulmonary effort is normal. No respiratory distress.     Breath sounds: Normal breath sounds. No wheezing.  Abdominal:     General: Bowel sounds are normal. There is no distension.     Palpations: Abdomen is soft.     Tenderness: There is no abdominal tenderness. There is no rebound.  Musculoskeletal:        General: Normal range of motion.     Cervical back: Normal range of motion and neck supple.  Neurological:     Mental Status: She is alert and oriented to person, place, and time.     Cranial Nerves: No cranial nerve deficit.  Skin:    General: Skin is warm and dry.  Psychiatric:        Judgment: Judgment normal.  Vitals reviewed.    Assessment:  ANNUAL EXAM 1. Women's annual routine gynecological examination   2. Irregular menses      Screening Plan:            1.  Cervical Screening-  Pap smear schedule reviewed with patient  2. Breast screening- Exam annually and mammogram>40 planned  Plan [place] and MMG LEFT due to exam today Discussed FCC as a potential diagnosis  3. Colonoscopy every 10 years, Hemoccult testing - after age 75  4. Labs managed by PCP  5. Counseling for contraception:  bilateral tubal ligation  [facility] - 07/19/21 0857       Pregnancy Intention Screening   Does the patient want to become pregnant in the next year? No    Does the patient's partner want to become pregnant in the next year? No    Would the patient like to discuss contraceptive options today? No      Contraception Wrap Up   Current Method Oral Contraceptive;Female Sterilization    End Method Oral  Contraceptive;Female Sterilization    Contraception Counseling Provided No             Other:  1. Women's annual routine gynecological examination PAP and Breast exam intervals and importance discussed  2. Irregular menses - Patient has abnormal uterine bleeding . She has a normal exam today, with no evidence of lesions.  Evaluation includes the following: exam, labs such as hormonal testing, and pelvic ultrasound to evaluate for any structural gynecologic abnormalities.  Patient to follow up after testing.  [place] UTD. [facility] today. Prior OCPs no help.  Treatment option for menorrhagia or menometrorrhagia discussed in great detail with the patient.  Options include hormonal therapy, IUD therapy such as Mirena, D&C, Ablation, and Hysterectomy.  The pros and cons of each option discussed with patient.  She desires ablation  - Surgical pathology from [facility] sent  Endometrial Biopsy After discussion with the patient regarding her abnormal uterine bleeding I recommended that she proceed with an endometrial biopsy for further diagnosis. The risks, benefits, alternatives, and indications for an endometrial biopsy were discussed with the patient in detail. She understood the risks including infection, bleeding, cervical laceration and uterine perforation.  Verbal consent was obtained.   PROCEDURE NOTE:  Pipelle endometrial biopsy was performed using aseptic technique with iodine preparation.  The uterus was sounded to a length of 7 cm.  Adequate sampling was obtained with minimal blood loss.  The  patient tolerated the procedure well.  Disposition will be pending pathology.    F/U  Return in about 1 year (around 07/19/2022) for Annual.  Also return for endometrial ablation  Barnett Applebaum, MD, [facility], [facility] 07/19/2021  9:22 AM

## 2021-07-19 NOTE — Patient Instructions (Signed)
Endometrial Ablation Endometrial ablation is a procedure that destroys the thin inner layer of the lining of the uterus (endometrium). This procedure may be done: To stop heavy menstrual periods. To stop bleeding that is causing anemia. To control irregular bleeding. To treat bleeding caused by small tumors (fibroids) in the endometrium. This procedure is often done as an alternative to major surgery, such as removal of the uterus and cervix (hysterectomy). As a result of this procedure: You may not be able to have children. However, if you have not yet gone through menopause: You may still have a small chance of getting pregnant. You will need to use a reliable method of birth control after the procedure to prevent pregnancy. You may stop having a menstrual period, or you may have only a small amount of bleeding during your period. Menstruation may return several years after the procedure. Tell a health care provider about: Any allergies you have. All medicines you are taking, including vitamins, herbs, eye drops, creams, and over-the-counter medicines. Any problems you or family members have had with the use of anesthetic medicines. Any blood disorders you have. Any surgeries you have had. Any medical conditions you have. Whether you are pregnant or may be pregnant. What are the risks? Generally, this is a safe procedure. However, problems may occur, including: A hole (perforation) in the uterus or bowel. Infection in the uterus, bladder, or vagina. Bleeding. Allergic reaction to medicines. Damage to nearby structures or organs. An air bubble in the lung (air embolus). Problems with pregnancy. Failure of the procedure. Decreased ability to diagnose cancer in the endometrium. Scar tissue forms after the procedure, making it more difficult to get a sample of the uterine lining. What happens before the procedure? Medicines Ask your health care provider about: Changing or stopping your  regular medicines. This is especially important if you take diabetes medicines or blood thinners. Taking medicines such as aspirin and ibuprofen. These medicines can thin your blood. Do not take these medicines before your procedure if your doctor tells you not to take them. Taking over-the-counter medicines, vitamins, herbs, and supplements. Tests You will have tests of your endometrium to make sure there are no precancerous cells or cancer cells present. You may have an ultrasound of the uterus. General instructions Do not use any products that contain nicotine or tobacco for at least 4 weeks before the procedure. These include cigarettes, chewing tobacco, and vaping devices, such as e-cigarettes. If you need help quitting, ask your health care provider. You may be given medicines to thin the endometrium. Ask your health care provider what steps will be taken to help prevent infection. These steps may include: Removing hair at the surgery site. Washing skin with a germ-killing soap. Taking antibiotic medicine. Plan to have a responsible adult take you home from the hospital or clinic. Plan to have a responsible adult care for you for the time you are told after you leave the hospital or clinic. This is important. What happens during the procedure?  You will lie on an exam table with your feet and legs supported as in a pelvic exam. An IV will be inserted into one of your veins. You will be given a medicine to help you relax (sedative). A surgical tool with a light and camera (resectoscope) will be inserted into your vagina and moved into your uterus. This allows your surgeon to see inside your uterus. Endometrial tissue will be destroyed and removed, using one of the following methods: Radiofrequency. This  uses an electrical current to destroy the endometrium. Cryotherapy. This uses extreme cold to freeze the endometrium. Heated fluid. This uses a heated salt and water (saline) solution to  destroy the endometrium. Microwave. This uses high-energy microwaves to heat up the endometrium and destroy it. Thermal balloon. This involves inserting a catheter with a balloon tip into the uterus. The balloon tip is filled with heated fluid to destroy the endometrium. The procedure may vary among health care providers and hospitals. What happens after the procedure? Your blood pressure, heart rate, breathing rate, and blood oxygen level will be monitored until you leave the hospital or clinic. You may have vaginal bleeding for 4-6 weeks after the procedure. You may also have: Cramps. A thin, watery vaginal discharge that is light pink or brown. A need to urinate more than usual. Nausea. If you were given a sedative during the procedure, it can affect you for several hours. Do not drive or operate machinery until your health care provider says that it is safe. Do not have sex or insert anything into your vagina until your health care provider says it is safe. Summary Endometrial ablation is done to treat many causes of heavy menstrual bleeding. The procedure destroys the thin inner layer of the lining of the uterus (endometrium). This procedure is often done as an alternative to major surgery, such as removal of the uterus and cervix (hysterectomy). Plan to have a responsible adult take you home from the hospital or clinic. This information is not intended to replace advice given to you by your health care provider. Make sure you discuss any questions you have with your health care provider. Document Revised: 02/05/2020 Document Reviewed: 02/05/2020 Elsevier Patient Education  2022 Elsevier Inc.  Total Laparoscopic Hysterectomy A total laparoscopic hysterectomy is a minimally invasive surgery to remove the uterus and cervix. The fallopian tubes and ovaries can also be removed during this surgery, if necessary. This procedure may be done to treat problems such as: Growths in the uterus  (uterine fibroids) that are not cancer but cause symptoms. A condition that causes the lining of the uterus to grow in other areas (endometriosis). Problems with pelvic support. Cancer of the cervix, ovaries, uterus, or tissue that lines the uterus (endometrium). Excessive bleeding in the uterus. After this procedure, you will no longer be able to have a baby, and you will no longer have a menstrual period. Tell a health care provider about: Any allergies you have. All medicines you are taking, including vitamins, herbs, eye drops, creams, and over-the-counter medicines. Any problems you or family members have had with anesthetic medicines. Any blood disorders you have. Any surgeries you have had. Any medical conditions you have. Whether you are pregnant or may be pregnant. What are the risks? Generally, this is a safe procedure. However, problems may occur, including: Infection. Bleeding. Blood clots in the legs or lungs. Allergic reactions to medicines. Damage to nearby structures or organs. Having to change from this surgery to one in which a large incision is made in the abdomen (abdominal hysterectomy). What happens before the procedure? Staying hydrated Follow instructions from your health care provider about hydration, which may include: Up to 2 hours before the procedure - you may continue to drink clear liquids, such as water, clear fruit juice, black coffee, and plain tea.  Eating and drinking restrictions Follow instructions from your health care provider about eating and drinking, which may include: 8 hours before the procedure - stop eating heavy meals or  foods, such as meat, fried foods, or fatty foods. 6 hours before the procedure - stop eating light meals or foods, such as toast or cereal. 6 hours before the procedure - stop drinking milk or drinks that contain milk. 2 hours before the procedure - stop drinking clear liquids. Medicines Ask your health care provider  about: Changing or stopping your regular medicines. This is especially important if you are taking diabetes medicines or blood thinners. Taking medicines such as aspirin and ibuprofen. These medicines can thin your blood. Do not take these medicines unless your health care provider tells you to take them. Taking over-the-counter medicines, vitamins, herbs, and supplements. You may be asked to take medicine that helps you have a bowel movement (laxative) to prevent constipation. General instructions If you were asked to do bowel preparation before the procedure, follow instructions from your health care provider. This procedure can affect the way you feel about yourself. Talk with your health care provider about the physical and emotional changes hysterectomy may cause. Do not use any products that contain nicotine or tobacco for at least 4 weeks before the procedure. These products include cigarettes, chewing tobacco, and vaping devices, such as e-cigarettes. If you need help quitting, ask your health care provider. Plan to have a responsible adult take you home from the hospital or clinic. Plan to have a responsible adult care for you for the time you are told after you leave the hospital or clinic. This is important. Surgery safety Ask your health care provider: How your surgery site will be marked. What steps will be taken to help prevent infection. These may include: Removing hair at the surgery site. Washing skin with a germ-killing soap. Receiving antibiotic medicine. What happens during the procedure? An IV will be inserted into one of your veins. You will be given one or more of the following: A medicine to help you relax (sedative). A medicine to make you fall asleep (general anesthetic). A medicine to numb the area (local anesthetic). A medicine that is injected into your spine to numb the area below and slightly above the injection site (spinal anesthetic). A medicine that is  injected into an area of your body to numb everything below the injection site (regional anesthetic). A gas will be used to inflate your abdomen. This will allow your surgeon to look inside your abdomen and do the surgery. Three or four small incisions will be made in your abdomen. A small device with a light (laparoscope) will be inserted into one of your incisions. Surgical instruments will be inserted through the other incisions in order to perform the procedure. Your uterus and cervix may be removed through your vagina or cut into small pieces and removed through the small incisions. Any other organs that need to be removed will also be removed this way. The gas will be released from inside your abdomen. Your incisions will be closed with stitches (sutures), skin glue, or adhesive strips. A bandage (dressing) may be placed over your incisions. The procedure may vary among health care providers and hospitals. What happens after the procedure? Your blood pressure, heart rate, breathing rate, and blood oxygen level will be monitored until you leave the hospital or clinic. You will be given medicine for pain as needed. You will be encouraged to walk as soon as possible. You will also use a device to help you breathe or do breathing exercises to keep your lungs clear. You may have to wear compression stockings. These stockings help  to prevent blood clots and reduce swelling in your legs. You will need to wear a sanitary pad for vaginal discharge or bleeding. Summary Total laparoscopic hysterectomy is a procedure to remove your uterus, cervix, and sometimes the fallopian tubes and ovaries. This procedure can affect the way you feel about yourself. Talk with your health care provider about the physical and emotional changes hysterectomy may cause. After this procedure, you will no longer be able to have a baby, and you will no longer have a menstrual period. You will be given pain medicine to control  discomfort after this procedure. Plan to have a responsible adult take you home from the hospital or clinic. This information is not intended to replace advice given to you by your health care provider. Make sure you discuss any questions you have with your health care provider. Document Revised: 03/19/2020 Document Reviewed: 03/19/2020 Elsevier Patient Education  2022 [employer].

## 2021-07-20 ENCOUNTER — Other Ambulatory Visit: Payer: Self-pay | Admitting: Obstetrics & Gynecology

## 2021-07-20 DIAGNOSIS — N6323 Unspecified lump in the left breast, lower outer quadrant: Secondary | ICD-10-CM

## 2021-07-21 ENCOUNTER — Other Ambulatory Visit: Payer: Self-pay | Admitting: Family Medicine

## 2021-07-21 DIAGNOSIS — J341 Cyst and mucocele of nose and nasal sinus: Secondary | ICD-10-CM

## 2021-07-21 LAB — SURGICAL PATHOLOGY

## 2021-07-21 NOTE — Telephone Encounter (Signed)
Requested medication (s) are due for refill today:   Yes  Requested medication (s) are on the active medication list:   Yes  Future visit scheduled:   Yes   Last ordered: 07/06/2021 16 ml, 1 refill  Returned because phar. Is requesting a 90 day supply.  (10ml)   Requested Prescriptions  Pending Prescriptions Disp Refills   fluticasone (FLONASE) 50 MCG/ACT nasal spray [Pharmacy Med Name: FLUTICASONE PROP 50 MCG SPRAY] 48 mL 1    Sig: SPRAY 2 SPRAYS INTO EACH NOSTRIL EVERY DAY     Ear, Nose, and Throat: Nasal Preparations - Corticosteroids Passed - 07/21/2021 10:28 AM      Passed - Valid encounter within last 12 months    Recent Outpatient Visits           3 days ago Generalized anxiety disorder   [facility] Jerrol Banana, MD   1 month ago Mucocele of maxillary sinus   [facility] Jerrol Banana, MD       Future Appointments             In 3 weeks Ashley Royalty, Ocie Bob, MD [facility]

## 2021-08-03 ENCOUNTER — Ambulatory Visit
Admission: RE | Admit: 2021-08-03 | Discharge: 2021-08-03 | Disposition: A | Discharge status: Home / Self Care | Payer: Managed Care, Other (non HMO) | Source: Ambulatory Visit | Attending: Obstetrics & Gynecology | Admitting: Obstetrics & Gynecology

## 2021-08-03 ENCOUNTER — Other Ambulatory Visit: Payer: Self-pay

## 2021-08-03 DIAGNOSIS — N6323 Unspecified lump in the left breast, lower outer quadrant: Secondary | ICD-10-CM | POA: Insufficient documentation

## 2021-08-04 ENCOUNTER — Telehealth: Payer: Self-pay | Admitting: Obstetrics & Gynecology

## 2021-08-04 NOTE — Telephone Encounter (Signed)
Attempted to reach the patient to schedule surgery. Left message to return call. °

## 2021-08-04 NOTE — Telephone Encounter (Signed)
-----   Message from Nadara Mustard, MD sent at 07/19/2021  9:33 AM EST ----- Regarding: 2. Sch Surgery Surgery Booking Request Patient Full Name:  Deanna Vaughn  MRN: [id number]  DOB: 20-Oct-1990  Surgeon: Letitia Libra, MD  Requested Surgery Date and Time: Jan 2023 Primary Diagnosis AND Code: Irregular bleeding Secondary Diagnosis and Code: N92.6 Surgical Procedure: Hysteroscopy D&C with Ablation RNFA Requested?: No L&D Notification: No Admission Status: same day surgery Length of Surgery: 25 min Special Case Needs: No H&P: Yes Phone Interview???:  Yes Interpreter: No Medical Clearance:  No Special Scheduling Instructions: No Any known health/anesthesia issues, diabetes, sleep apnea, latex allergy, defibrillator/pacemaker?: No Acuity: P3   (P1 highest, P2 delay may cause harm, P3 low, elective gyn, P4 lowest)

## 2021-08-10 ENCOUNTER — Other Ambulatory Visit: Payer: Self-pay | Admitting: Family Medicine

## 2021-08-10 DIAGNOSIS — F411 Generalized anxiety disorder: Secondary | ICD-10-CM

## 2021-08-10 NOTE — Telephone Encounter (Signed)
Requested medication (s) are due for refill today:   No  Requested medication (s) are on the active medication list:   Yes  Future visit scheduled:   Yes 08/15/2021   Last ordered: 07/18/2021 #30, 2 refills  Returned because there is a note indicating on 07/19/2021 that she is not taking this.  Just started on it and has f/u 1/16.     Requested Prescriptions  Pending Prescriptions Disp Refills   citalopram (CELEXA) 20 MG tablet [Pharmacy Med Name: CITALOPRAM HBR 20 MG TABLET] 90 tablet 1    Sig: TAKE 1 TABLET BY MOUTH EVERY DAY     Psychiatry:  Antidepressants - SSRI Passed - 08/10/2021  1:32 PM      Passed - Valid encounter within last 6 months    Recent Outpatient Visits           3 weeks ago Generalized anxiety disorder   [facility] Montel Culver, MD   1 month ago Mucocele of maxillary sinus   [facility] Montel Culver, MD       Future Appointments             In 5 days Zigmund Daniel, Earley Abide, MD [facility], [place]

## 2021-08-15 ENCOUNTER — Ambulatory Visit: Payer: Managed Care, Other (non HMO) | Admitting: Family Medicine

## 2021-08-19 ENCOUNTER — Ambulatory Visit: Payer: Managed Care, Other (non HMO) | Admitting: Family Medicine

## 2021-08-24 NOTE — Telephone Encounter (Signed)
Left msg to rtn call

## 2021-08-25 ENCOUNTER — Other Ambulatory Visit: Payer: Self-pay | Admitting: Obstetrics & Gynecology

## 2021-08-25 DIAGNOSIS — N926 Irregular menstruation, unspecified: Secondary | ICD-10-CM

## 2021-08-25 DIAGNOSIS — N6323 Unspecified lump in the left breast, lower outer quadrant: Secondary | ICD-10-CM

## 2021-08-25 NOTE — Telephone Encounter (Signed)
Called patient to schedule Hysteroscopy D&C with Ablation w Phylliss Blakes 2/9  H&P 2/2 @ 4:15    Pre-admit phone call appointment - 2/3 @ 1-5 pm  Advised that pt may also receive calls from the hospital pharmacy and pre-service center.  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   [employer] - Vic Ripper has confirmed

## 2021-09-01 ENCOUNTER — Other Ambulatory Visit: Payer: Self-pay

## 2021-09-01 ENCOUNTER — Ambulatory Visit (INDEPENDENT_AMBULATORY_CARE_PROVIDER_SITE_OTHER): Payer: Managed Care, Other (non HMO) | Admitting: Obstetrics & Gynecology

## 2021-09-01 ENCOUNTER — Encounter: Payer: Self-pay | Admitting: Obstetrics & Gynecology

## 2021-09-01 VITALS — BP 120/80 | Ht 60.0 in | Wt 183.0 lb

## 2021-09-01 DIAGNOSIS — N926 Irregular menstruation, unspecified: Secondary | ICD-10-CM

## 2021-09-01 NOTE — H&P (View-Only) (Signed)
PRE-OPERATIVE HISTORY AND PHYSICAL EXAM  HPI:  Deanna Vaughn is a 31 y.o. [id number] No LMP recorded. (Menstrual status: Oral contraceptives).; she is being admitted for surgery related to abnormal uterine bleeding.  Pt has 2-3 weeks of bleeding monthly and at irregular intervals.  Prior US and EMB normal.  Does not tolerate or respond well to hormone therapy.  Prior BTL, no desire for pregnancy.  PMHx: Past Medical History:  Diagnosis Date   Anemia    Past Surgical History:  Procedure Laterality Date   CESAREAN SECTION  08/01/2007   CESAREAN SECTION WITH BILATERAL TUBAL LIGATION N/A 11/18/2015   Procedure: CESAREAN SECTION ;  Surgeon: Gae Dry, MD;  Location: [facility] ORS;  Service: Obstetrics;  Laterality: N/A;   CESAREAN SECTION WITH BILATERAL TUBAL LIGATION N/A 08/08/2018   Procedure: CESAREAN SECTION WITH BILATERAL TUBAL LIGATION;  Surgeon: Gae Dry, MD;  Location: [facility] ORS;  Service: Obstetrics;  Laterality: N/A;   CHOLECYSTECTOMY  2022   Family History  Problem Relation Age of Onset   Hypertension Mother    Diabetes Mother    Breast cancer Maternal Grandmother        ? age   Social History   Tobacco Use   Smoking status: Never   Smokeless tobacco: Never  Vaping Use   Vaping Use: Never used  Substance Use Topics   Alcohol use: Not Currently   Drug use: Never    Current Outpatient Medications:    citalopram (CELEXA) 20 MG tablet, Take 1 tablet (20 mg total) by mouth daily., Disp: 30 tablet, Rfl: 2   diclofenac (VOLTAREN) 75 MG EC tablet, Take 1 tablet (75 mg total) by mouth 2 (two) times daily as needed., Disp: 60 tablet, Rfl: 1   fluticasone (FLONASE) 50 MCG/ACT nasal spray, SPRAY 2 SPRAYS INTO EACH NOSTRIL EVERY DAY, Disp: 48 mL, Rfl: 1   norethindrone (MICRONOR) 0.35 MG tablet, Take 1 tablet (0.35 mg total) by mouth daily., Disp: 28 tablet, Rfl: 11 Allergies: Patient has no known allergies.  Review of Systems  Constitutional:  Negative for chills,  fever and malaise/fatigue.  HENT:  Negative for congestion, sinus pain and sore throat.   Eyes:  Negative for blurred vision and pain.  Respiratory:  Negative for cough and wheezing.   Cardiovascular:  Negative for chest pain and leg swelling.  Gastrointestinal:  Negative for abdominal pain, constipation, diarrhea, heartburn, nausea and vomiting.  Genitourinary:  Negative for dysuria, frequency, hematuria and urgency.  Musculoskeletal:  Negative for back pain, joint pain, myalgias and neck pain.  Skin:  Negative for itching and rash.  Neurological:  Negative for dizziness, tremors and weakness.  Endo/Heme/Allergies:  Does not bruise/bleed easily.  Psychiatric/Behavioral:  Negative for depression. The patient is not nervous/anxious and does not have insomnia.    Objective: BP 120/80    Ht 5' (1.524 m)    Wt 183 lb (83 kg)    BMI 35.74 kg/m   Filed Weights   09/01/21 1601  Weight: 183 lb (83 kg)   Physical Exam Constitutional:      General: She is not in acute distress.    Appearance: She is well-developed.  HENT:     Head: Normocephalic and atraumatic. No laceration.     Right Ear: Hearing normal.     Left Ear: Hearing normal.     Mouth/Throat:     Pharynx: Uvula midline.  Eyes:     Pupils: Pupils are equal, round, and reactive to  light.  Neck:     Thyroid: No thyromegaly.  Cardiovascular:     Rate and Rhythm: Normal rate and regular rhythm.     Heart sounds: No murmur heard.   No friction rub. No gallop.  Pulmonary:     Effort: Pulmonary effort is normal. No respiratory distress.     Breath sounds: Normal breath sounds. No wheezing.  Abdominal:     General: Bowel sounds are normal. There is no distension.     Palpations: Abdomen is soft.     Tenderness: There is no abdominal tenderness. There is no rebound.  Musculoskeletal:        General: Normal range of motion.     Cervical back: Normal range of motion and neck supple.  Neurological:     Mental Status: She is alert  and oriented to person, place, and time.     Cranial Nerves: No cranial nerve deficit.  Skin:    General: Skin is warm and dry.  Psychiatric:        Judgment: Judgment normal.  Vitals reviewed.    Assessment: 1. Irregular menses   Plan hysteroscopy Dilation and curettage with Endometrial Ablation  I have had a careful discussion with this patient about all the options available and the risk/benefits of each. I have fully informed this patient that surgery may subject her to a variety of discomforts and risks: She understands that most patients have surgery with little difficulty, but problems can happen ranging from minor to fatal. These include nausea, vomiting, pain, bleeding, infection, poor healing, hernia, or formation of adhesions. Unexpected reactions may occur from any drug or anesthetic given. Unintended injury may occur to other pelvic or abdominal structures such as Fallopian tubes, ovaries, bladder, ureter (tube from kidney to bladder), or bowel. Nerves going from the pelvis to the legs may be injured. Any such injury may require immediate or later additional surgery to correct the problem. Excessive blood loss requiring transfusion is very unlikely but possible. Dangerous blood clots may form in the legs or lungs. Physical and sexual activity will be restricted in varying degrees for an indeterminate period of time but most often 2-6 weeks.  Finally, she understands that it is impossible to list every possible undesirable effect and that the condition for which surgery is done is not always cured or significantly improved, and in rare cases may be even worse.Ample time was given to answer all questions.  Barnett Applebaum, MD, [facility], [facility] 09/01/2021  4:08 PM

## 2021-09-01 NOTE — Progress Notes (Signed)
PRE-OPERATIVE HISTORY AND PHYSICAL EXAM  HPI:  Deanna Vaughn is a 31 y.o. [id number] No LMP recorded. (Menstrual status: Oral contraceptives).; she is being admitted for surgery related to abnormal uterine bleeding.  Pt has 2-3 weeks of bleeding monthly and at irregular intervals.  Prior US and EMB normal.  Does not tolerate or respond well to hormone therapy.  Prior BTL, no desire for pregnancy.  PMHx: Past Medical History:  Diagnosis Date   Anemia    Past Surgical History:  Procedure Laterality Date   CESAREAN SECTION  08/01/2007   CESAREAN SECTION WITH BILATERAL TUBAL LIGATION N/A 11/18/2015   Procedure: CESAREAN SECTION ;  Surgeon: Gae Dry, MD;  Location: [facility] ORS;  Service: Obstetrics;  Laterality: N/A;   CESAREAN SECTION WITH BILATERAL TUBAL LIGATION N/A 08/08/2018   Procedure: CESAREAN SECTION WITH BILATERAL TUBAL LIGATION;  Surgeon: Gae Dry, MD;  Location: [facility] ORS;  Service: Obstetrics;  Laterality: N/A;   CHOLECYSTECTOMY  2022   Family History  Problem Relation Age of Onset   Hypertension Mother    Diabetes Mother    Breast cancer Maternal Grandmother        ? age   Social History   Tobacco Use   Smoking status: Never   Smokeless tobacco: Never  Vaping Use   Vaping Use: Never used  Substance Use Topics   Alcohol use: Not Currently   Drug use: Never    Current Outpatient Medications:    citalopram (CELEXA) 20 MG tablet, Take 1 tablet (20 mg total) by mouth daily., Disp: 30 tablet, Rfl: 2   diclofenac (VOLTAREN) 75 MG EC tablet, Take 1 tablet (75 mg total) by mouth 2 (two) times daily as needed., Disp: 60 tablet, Rfl: 1   fluticasone (FLONASE) 50 MCG/ACT nasal spray, SPRAY 2 SPRAYS INTO EACH NOSTRIL EVERY DAY, Disp: 48 mL, Rfl: 1   norethindrone (MICRONOR) 0.35 MG tablet, Take 1 tablet (0.35 mg total) by mouth daily., Disp: 28 tablet, Rfl: 11 Allergies: Patient has no known allergies.  Review of Systems  Constitutional:  Negative for chills,  fever and malaise/fatigue.  HENT:  Negative for congestion, sinus pain and sore throat.   Eyes:  Negative for blurred vision and pain.  Respiratory:  Negative for cough and wheezing.   Cardiovascular:  Negative for chest pain and leg swelling.  Gastrointestinal:  Negative for abdominal pain, constipation, diarrhea, heartburn, nausea and vomiting.  Genitourinary:  Negative for dysuria, frequency, hematuria and urgency.  Musculoskeletal:  Negative for back pain, joint pain, myalgias and neck pain.  Skin:  Negative for itching and rash.  Neurological:  Negative for dizziness, tremors and weakness.  Endo/Heme/Allergies:  Does not bruise/bleed easily.  Psychiatric/Behavioral:  Negative for depression. The patient is not nervous/anxious and does not have insomnia.    Objective: BP 120/80    Ht 5' (1.524 m)    Wt 183 lb (83 kg)    BMI 35.74 kg/m   Filed Weights   09/01/21 1601  Weight: 183 lb (83 kg)   Physical Exam Constitutional:      General: She is not in acute distress.    Appearance: She is well-developed.  HENT:     Head: Normocephalic and atraumatic. No laceration.     Right Ear: Hearing normal.     Left Ear: Hearing normal.     Mouth/Throat:     Pharynx: Uvula midline.  Eyes:     Pupils: Pupils are equal, round, and reactive to  light.  Neck:     Thyroid: No thyromegaly.  Cardiovascular:     Rate and Rhythm: Normal rate and regular rhythm.     Heart sounds: No murmur heard.   No friction rub. No gallop.  Pulmonary:     Effort: Pulmonary effort is normal. No respiratory distress.     Breath sounds: Normal breath sounds. No wheezing.  Abdominal:     General: Bowel sounds are normal. There is no distension.     Palpations: Abdomen is soft.     Tenderness: There is no abdominal tenderness. There is no rebound.  Musculoskeletal:        General: Normal range of motion.     Cervical back: Normal range of motion and neck supple.  Neurological:     Mental Status: She is alert  and oriented to person, place, and time.     Cranial Nerves: No cranial nerve deficit.  Skin:    General: Skin is warm and dry.  Psychiatric:        Judgment: Judgment normal.  Vitals reviewed.    Assessment: 1. Irregular menses   Plan hysteroscopy Dilation and curettage with Endometrial Ablation  I have had a careful discussion with this patient about all the options available and the risk/benefits of each. I have fully informed this patient that surgery may subject her to a variety of discomforts and risks: She understands that most patients have surgery with little difficulty, but problems can happen ranging from minor to fatal. These include nausea, vomiting, pain, bleeding, infection, poor healing, hernia, or formation of adhesions. Unexpected reactions may occur from any drug or anesthetic given. Unintended injury may occur to other pelvic or abdominal structures such as Fallopian tubes, ovaries, bladder, ureter (tube from kidney to bladder), or bowel. Nerves going from the pelvis to the legs may be injured. Any such injury may require immediate or later additional surgery to correct the problem. Excessive blood loss requiring transfusion is very unlikely but possible. Dangerous blood clots may form in the legs or lungs. Physical and sexual activity will be restricted in varying degrees for an indeterminate period of time but most often 2-6 weeks.  Finally, she understands that it is impossible to list every possible undesirable effect and that the condition for which surgery is done is not always cured or significantly improved, and in rare cases may be even worse.Ample time was given to answer all questions.  Barnett Applebaum, MD, [facility], [facility] 09/01/2021  4:08 PM

## 2021-09-01 NOTE — Patient Instructions (Signed)
Endometrial Ablation Endometrial ablation is a procedure that destroys the thin inner layer of the lining of the uterus (endometrium). This procedure may be done: To stop heavy menstrual periods. To stop bleeding that is causing anemia. To control irregular bleeding. To treat bleeding caused by small tumors (fibroids) in the endometrium. This procedure is often done as an alternative to major surgery, such as removal of the uterus and cervix (hysterectomy). As a result of this procedure: You may not be able to have children. However, if you have not yet gone through menopause: You may still have a small chance of getting pregnant. You will need to use a reliable method of birth control after the procedure to prevent pregnancy. You may stop having a menstrual period, or you may have only a small amount of bleeding during your period. Menstruation may return several years after the procedure. Tell a health care provider about: Any allergies you have. All medicines you are taking, including vitamins, herbs, eye drops, creams, and over-the-counter medicines. Any problems you or family members have had with the use of anesthetic medicines. Any blood disorders you have. Any surgeries you have had. Any medical conditions you have. Whether you are pregnant or may be pregnant. What are the risks? Generally, this is a safe procedure. However, problems may occur, including: A hole (perforation) in the uterus or bowel. Infection in the uterus, bladder, or vagina. Bleeding. Allergic reaction to medicines. Damage to nearby structures or organs. An air bubble in the lung (air embolus). Problems with pregnancy. Failure of the procedure. Decreased ability to diagnose cancer in the endometrium. Scar tissue forms after the procedure, making it more difficult to get a sample of the uterine lining. What happens before the procedure? Medicines Ask your health care provider about: Changing or stopping your  regular medicines. This is especially important if you take diabetes medicines or blood thinners. Taking medicines such as aspirin and ibuprofen. These medicines can thin your blood. Do not take these medicines before your procedure if your doctor tells you not to take them. Taking over-the-counter medicines, vitamins, herbs, and supplements. Tests You will have tests of your endometrium to make sure there are no precancerous cells or cancer cells present. You may have an ultrasound of the uterus. General instructions Do not use any products that contain nicotine or tobacco for at least 4 weeks before the procedure. These include cigarettes, chewing tobacco, and vaping devices, such as e-cigarettes. If you need help quitting, ask your health care provider. You may be given medicines to thin the endometrium. Ask your health care provider what steps will be taken to help prevent infection. These steps may include: Removing hair at the surgery site. Washing skin with a germ-killing soap. Taking antibiotic medicine. Plan to have a responsible adult take you home from the hospital or clinic. Plan to have a responsible adult care for you for the time you are told after you leave the hospital or clinic. This is important. What happens during the procedure?  You will lie on an exam table with your feet and legs supported as in a pelvic exam. An IV will be inserted into one of your veins. You will be given a medicine to help you relax (sedative). A surgical tool with a light and camera (resectoscope) will be inserted into your vagina and moved into your uterus. This allows your surgeon to see inside your uterus. Endometrial tissue will be destroyed and removed, using one of the following methods: Radiofrequency. This   uses an electrical current to destroy the endometrium. Cryotherapy. This uses extreme cold to freeze the endometrium. Heated fluid. This uses a heated salt and water (saline) solution to  destroy the endometrium. Microwave. This uses high-energy microwaves to heat up the endometrium and destroy it. Thermal balloon. This involves inserting a catheter with a balloon tip into the uterus. The balloon tip is filled with heated fluid to destroy the endometrium. The procedure may vary among health care providers and hospitals. What happens after the procedure? Your blood pressure, heart rate, breathing rate, and blood oxygen level will be monitored until you leave the hospital or clinic. You may have vaginal bleeding for 4-6 weeks after the procedure. You may also have: Cramps. A thin, watery vaginal discharge that is light pink or brown. A need to urinate more than usual. Nausea. If you were given a sedative during the procedure, it can affect you for several hours. Do not drive or operate machinery until your health care provider says that it is safe. Do not have sex or insert anything into your vagina until your health care provider says it is safe. Summary Endometrial ablation is done to treat many causes of heavy menstrual bleeding. The procedure destroys the thin inner layer of the lining of the uterus (endometrium). This procedure is often done as an alternative to major surgery, such as removal of the uterus and cervix (hysterectomy). Plan to have a responsible adult take you home from the hospital or clinic. This information is not intended to replace advice given to you by your health care provider. Make sure you discuss any questions you have with your health care provider. Document Revised: 02/05/2020 Document Reviewed: 02/05/2020 Elsevier Patient Education  2022 [employer] Inc[employer]  

## 2021-09-02 ENCOUNTER — Other Ambulatory Visit
Admission: RE | Admit: 2021-09-02 | Discharge: 2021-09-02 | Disposition: A | Discharge status: Home / Self Care | Payer: Managed Care, Other (non HMO) | Source: Ambulatory Visit | Attending: Obstetrics & Gynecology | Admitting: Obstetrics & Gynecology

## 2021-09-02 ENCOUNTER — Telehealth: Payer: Self-pay

## 2021-09-02 ENCOUNTER — Other Ambulatory Visit: Payer: Self-pay

## 2021-09-02 NOTE — Patient Instructions (Addendum)
Your procedure is scheduled on: 09/08/21 - Thursday Report to the Registration Desk on the 1st floor of the [facility]. To find out your arrival time, please call [phone] between 1PM - 3PM on: 09/07/21 - Wednesday  REMEMBER: Instructions that are not followed completely may result in serious medical risk, up to and including death; or upon the discretion of your surgeon and anesthesiologist your surgery may need to be rescheduled.  Do not eat food or drink any fluids after midnight the night before surgery.  No gum chewing, lozengers or hard candies.  TAKE THESE MEDICATIONS THE MORNING OF SURGERY WITH A SIP OF WATER:  - norethindrone (MICRONOR) 0.35 MG tablet  One week prior to surgery: diclofenac  Stop Anti-inflammatories (NSAIDS) such as Advil, Aleve, Ibuprofen, Motrin, Naproxen, Naprosyn and Aspirin based products such as Excedrin, Goodys Powder, BC Powder.  Stop ANY OVER THE COUNTER supplements until after surgery.  You may take Tylenol if needed for pain up until the day of surgery.  No Alcohol for 24 hours before or after surgery.  No Smoking including e-cigarettes for 24 hours prior to surgery.  No chewable tobacco products for at least 6 hours prior to surgery.  No nicotine patches on the day of surgery.  Do not use any "recreational" drugs for at least a week prior to your surgery.  Please be advised that the combination of cocaine and anesthesia may have negative outcomes, up to and including death. If you test positive for cocaine, your surgery will be cancelled.  On the morning of surgery brush your teeth with toothpaste and water, you may rinse your mouth with mouthwash if you wish. Do not swallow any toothpaste or mouthwash.  Use CHG Soap or wipes as directed on instruction sheet.  Do not wear jewelry, make-up, hairpins, clips or nail polish.  Do not wear lotions, powders, or perfumes.   Do not shave body from the neck down 48 hours prior to surgery just  in case you cut yourself which could leave a site for infection.  Also, freshly shaved skin may become irritated if using the CHG soap.  Contact lenses, hearing aids and dentures may not be worn into surgery.  Do not bring valuables to the hospital. [facility] is not responsible for any missing/lost belongings or valuables.   Notify your doctor if there is any change in your medical condition (cold, fever, infection).  Wear comfortable clothing (specific to your surgery type) to the hospital.  After surgery, you can help prevent lung complications by doing breathing exercises.  Take deep breaths and cough every 1-2 hours. Your doctor may order a device called an Incentive Spirometer to help you take deep breaths. When coughing or sneezing, hold a pillow firmly against your incision with both hands. This is called splinting. Doing this helps protect your incision. It also decreases belly discomfort.  If you are being admitted to the hospital overnight, leave your suitcase in the car. After surgery it may be brought to your room.  If you are being discharged the day of surgery, you will not be allowed to drive home. You will need a responsible adult (18 years or older) to drive you home and stay with you that night.   If you are taking public transportation, you will need to have a responsible adult (18 years or older) with you. Please confirm with your physician that it is acceptable to use public transportation.   Please call the Pre-admissions Testing Dept. at [phone]  [phone] if you have any questions about these instructions.  Surgery Visitation Policy:  Patients undergoing a surgery or procedure may have one family member or support person with them as long as that person is not COVID-19 positive or experiencing its symptoms.  That person may remain in the waiting area during the procedure and may rotate out with other people.  Inpatient Visitation:    Visiting hours are 7 a.m.  to 8 p.m. Up to two visitors ages 16+ are allowed at one time in a patient room. The visitors may rotate out with other people during the day. Visitors must check out when they leave, or other visitors will not be allowed. One designated support person may remain overnight. The visitor must pass COVID-19 screenings, use hand sanitizer when entering and exiting the patients room and wear a mask at all times, including in the patients room. Patients must also wear a mask when staff or their visitor are in the room. Masking is required regardless of vaccination status.

## 2021-09-02 NOTE — Telephone Encounter (Signed)
Patient needs an appointment for future refills.  Please schedule.  

## 2021-09-02 NOTE — Telephone Encounter (Signed)
For your information  

## 2021-09-02 NOTE — Telephone Encounter (Signed)
-----   Message from Montel Culver, MD sent at 09/01/2021  5:17 PM EST ----- Regarding: Needs visit Patient needs follow-up visit prior to any additional refills of citalopram 20 mg to ensure appropriate administration. Please schedule visit accordingly.

## 2021-09-07 NOTE — Anesthesia Preprocedure Evaluation (Addendum)
Anesthesia Evaluation  Patient identified by MRN, date of birth, ID band Patient awake    Reviewed: Allergy & Precautions, NPO status , Patient's Chart, lab work & pertinent test results  History of Anesthesia Complications Negative for: history of anesthetic complications  Airway Mallampati: III  TM Distance: >3 FB Neck ROM: Full    Dental no notable dental hx. (+) Chipped,    Pulmonary neg pulmonary ROS, neg sleep apnea, neg COPD,    breath sounds clear to auscultation- rhonchi (-) wheezing      Cardiovascular Exercise Tolerance: Good (-) hypertension(-) CAD, (-) Past MI, (-) Cardiac Stents and (-) CABG  Rhythm:Regular Rate:Normal - Systolic murmurs and - Diastolic murmurs    Neuro/Psych neg Seizures PSYCHIATRIC DISORDERS Anxiety negative neurological ROS     GI/Hepatic negative GI ROS, Neg liver ROS,   Endo/Other  negative endocrine ROSneg diabetes  Renal/GU negative Renal ROS     Musculoskeletal negative musculoskeletal ROS (+)   Abdominal (+) + obese, Gravid abdomen  Peds  Hematology  (+) Blood dyscrasia, anemia ,   Anesthesia Other Findings Irregular bleeding   Past Medical History: No date: Anemia   Reproductive/Obstetrics                             Lab Results  Component Value Date   WBC 7.2 05/23/2021   HGB 10.2 (L) 05/23/2021   HCT 31.4 (L) 05/23/2021   MCV 84.4 05/23/2021   PLT 263 05/23/2021    Anesthesia Physical  Anesthesia Plan  ASA: II  Anesthesia Plan: General   Post-op Pain Management: Tylenol PO (pre-op) and Toradol IV (intra-op)   Induction: Intravenous  PONV Risk Score and Plan: 2 and Ondansetron and TIVA  Airway Management Planned: Natural Airway and Simple Face Mask  Additional Equipment:   Intra-op Plan:   Post-operative Plan:   Informed Consent: I have reviewed the patients History and Physical, chart, labs and discussed the procedure  including the risks, benefits and alternatives for the proposed anesthesia with the patient or authorized representative who has indicated his/her understanding and acceptance.     Dental advisory given  Plan Discussed with: CRNA, Anesthesiologist and Surgeon  Anesthesia Plan Comments:       Anesthesia Quick Evaluation

## 2021-09-08 ENCOUNTER — Ambulatory Visit
Admission: RE | Admit: 2021-09-08 | Discharge: 2021-09-08 | Disposition: A | Discharge status: Home / Self Care | Payer: Managed Care, Other (non HMO) | Attending: Obstetrics & Gynecology | Admitting: Obstetrics & Gynecology

## 2021-09-08 ENCOUNTER — Other Ambulatory Visit: Payer: Self-pay

## 2021-09-08 ENCOUNTER — Ambulatory Visit: Payer: Managed Care, Other (non HMO) | Admitting: Anesthesiology

## 2021-09-08 ENCOUNTER — Encounter
Admission: RE | Disposition: A | Discharge status: Home / Self Care | Payer: Self-pay | Source: Home / Self Care | Attending: Obstetrics & Gynecology

## 2021-09-08 ENCOUNTER — Encounter: Payer: Self-pay | Admitting: Obstetrics & Gynecology

## 2021-09-08 DIAGNOSIS — N921 Excessive and frequent menstruation with irregular cycle: Secondary | ICD-10-CM | POA: Diagnosis present

## 2021-09-08 DIAGNOSIS — E669 Obesity, unspecified: Secondary | ICD-10-CM | POA: Diagnosis not present

## 2021-09-08 DIAGNOSIS — N926 Irregular menstruation, unspecified: Secondary | ICD-10-CM

## 2021-09-08 DIAGNOSIS — Z6835 Body mass index (BMI) 35.0-35.9, adult: Secondary | ICD-10-CM | POA: Diagnosis not present

## 2021-09-08 DIAGNOSIS — N920 Excessive and frequent menstruation with regular cycle: Secondary | ICD-10-CM | POA: Diagnosis present

## 2021-09-08 DIAGNOSIS — D759 Disease of blood and blood-forming organs, unspecified: Secondary | ICD-10-CM | POA: Diagnosis not present

## 2021-09-08 DIAGNOSIS — F419 Anxiety disorder, unspecified: Secondary | ICD-10-CM | POA: Insufficient documentation

## 2021-09-08 DIAGNOSIS — D649 Anemia, unspecified: Secondary | ICD-10-CM | POA: Insufficient documentation

## 2021-09-08 HISTORY — PX: HYSTEROSCOPY WITH D & C: SHX1775

## 2021-09-08 LAB — POCT PREGNANCY, URINE: Preg Test, Ur: NEGATIVE

## 2021-09-08 SURGERY — DILATATION AND CURETTAGE /HYSTEROSCOPY
Anesthesia: General

## 2021-09-08 MED ORDER — GABAPENTIN 600 MG PO TABS
300.0000 mg | ORAL_TABLET | Freq: Once | ORAL | Status: DC
  Filled 2021-09-08: qty 0.5

## 2021-09-08 MED ORDER — ONDANSETRON HCL 4 MG/2ML IJ SOLN
INTRAMUSCULAR | Status: AC
  Filled 2021-09-08: qty 2

## 2021-09-08 MED ORDER — PROPOFOL 10 MG/ML IV BOLUS
INTRAVENOUS | Status: AC
  Filled 2021-09-08: qty 40

## 2021-09-08 MED ORDER — KETOROLAC TROMETHAMINE 30 MG/ML IJ SOLN
INTRAMUSCULAR | Status: AC
  Filled 2021-09-08: qty 1

## 2021-09-08 MED ORDER — CHLORHEXIDINE GLUCONATE 0.12 % MT SOLN: 15.0000 mL | Freq: Once | OROMUCOSAL | Status: AC

## 2021-09-08 MED ORDER — PROPOFOL 10 MG/ML IV BOLUS
INTRAVENOUS | Status: AC
  Filled 2021-09-08: qty 20

## 2021-09-08 MED ORDER — DEXMEDETOMIDINE (PRECEDEX) IN NS 20 MCG/5ML (4 MCG/ML) IV SYRINGE
PREFILLED_SYRINGE | INTRAVENOUS | Status: DC | PRN
  Administered 2021-09-08 (×2): 10 ug via INTRAVENOUS

## 2021-09-08 MED ORDER — SODIUM CHLORIDE 0.9 % IR SOLN
Status: DC | PRN
  Administered 2021-09-08: 3000 mL

## 2021-09-08 MED ORDER — ORAL CARE MOUTH RINSE: 15.0000 mL | Freq: Once | OROMUCOSAL | Status: AC

## 2021-09-08 MED ORDER — ACETAMINOPHEN 650 MG RE SUPP
650.0000 mg | RECTAL | Status: DC | PRN
  Filled 2021-09-08: qty 1

## 2021-09-08 MED ORDER — MORPHINE SULFATE (PF) 2 MG/ML IV SOLN: 1.0000 mg | INTRAVENOUS | Status: DC | PRN

## 2021-09-08 MED ORDER — OXYCODONE HCL 5 MG PO TABS: 5.0000 mg | ORAL_TABLET | Freq: Once | ORAL | Status: DC | PRN

## 2021-09-08 MED ORDER — PHENYLEPHRINE HCL-NACL 20-0.9 MG/250ML-% IV SOLN
INTRAVENOUS | Status: AC
  Filled 2021-09-08: qty 250

## 2021-09-08 MED ORDER — ACETAMINOPHEN 325 MG PO TABS: 650.0000 mg | ORAL_TABLET | ORAL | Status: DC | PRN

## 2021-09-08 MED ORDER — ACETAMINOPHEN 10 MG/ML IV SOLN: 1000.0000 mg | Freq: Once | INTRAVENOUS | Status: DC | PRN

## 2021-09-08 MED ORDER — DROPERIDOL 2.5 MG/ML IJ SOLN
0.6250 mg | Freq: Once | INTRAMUSCULAR | Status: DC | PRN
  Filled 2021-09-08: qty 2

## 2021-09-08 MED ORDER — KETOROLAC TROMETHAMINE 30 MG/ML IJ SOLN
INTRAMUSCULAR | Status: DC | PRN
  Administered 2021-09-08: 30 mg via INTRAVENOUS

## 2021-09-08 MED ORDER — FENTANYL CITRATE (PF) 100 MCG/2ML IJ SOLN
INTRAMUSCULAR | Status: AC
  Filled 2021-09-08: qty 2

## 2021-09-08 MED ORDER — LACTATED RINGERS IV SOLN: INTRAVENOUS | Status: DC

## 2021-09-08 MED ORDER — 0.9 % SODIUM CHLORIDE (POUR BTL) OPTIME
TOPICAL | Status: DC | PRN
  Administered 2021-09-08: 500 mL

## 2021-09-08 MED ORDER — OXYCODONE-ACETAMINOPHEN 5-325 MG PO TABS: 1.0000 | ORAL_TABLET | ORAL | Status: DC | PRN

## 2021-09-08 MED ORDER — GABAPENTIN 300 MG PO CAPS
ORAL_CAPSULE | ORAL | Status: AC
  Administered 2021-09-08: 300 mg
  Filled 2021-09-08: qty 1

## 2021-09-08 MED ORDER — LIDOCAINE HCL (PF) 2 % IJ SOLN
INTRAMUSCULAR | Status: AC
  Filled 2021-09-08: qty 5

## 2021-09-08 MED ORDER — OXYCODONE-ACETAMINOPHEN 5-325 MG PO TABS: 1.0000 | ORAL_TABLET | ORAL | 0 refills | Status: DC | PRN

## 2021-09-08 MED ORDER — PROPOFOL 500 MG/50ML IV EMUL
INTRAVENOUS | Status: DC | PRN
  Administered 2021-09-08: 175 ug/kg/min via INTRAVENOUS

## 2021-09-08 MED ORDER — DEXAMETHASONE SODIUM PHOSPHATE 10 MG/ML IJ SOLN
INTRAMUSCULAR | Status: AC
  Filled 2021-09-08: qty 1

## 2021-09-08 MED ORDER — CHLORHEXIDINE GLUCONATE 0.12 % MT SOLN
OROMUCOSAL | Status: AC
  Administered 2021-09-08: 15 mL via OROMUCOSAL
  Filled 2021-09-08: qty 15

## 2021-09-08 MED ORDER — PROPOFOL 10 MG/ML IV BOLUS
INTRAVENOUS | Status: DC | PRN
Start: 2021-09-08 — End: 2021-09-08
  Administered 2021-09-08: 100 mg via INTRAVENOUS

## 2021-09-08 MED ORDER — MIDAZOLAM HCL 2 MG/2ML IJ SOLN
INTRAMUSCULAR | Status: DC | PRN
Start: 2021-09-08 — End: 2021-09-08
  Administered 2021-09-08 (×2): 1 mg via INTRAVENOUS

## 2021-09-08 MED ORDER — FAMOTIDINE 20 MG PO TABS: 20.0000 mg | ORAL_TABLET | Freq: Once | ORAL | Status: AC

## 2021-09-08 MED ORDER — FENTANYL CITRATE (PF) 100 MCG/2ML IJ SOLN
INTRAMUSCULAR | Status: DC | PRN
  Administered 2021-09-08 (×2): 50 ug via INTRAVENOUS
  Administered 2021-09-08: 25 ug via INTRAVENOUS

## 2021-09-08 MED ORDER — OXYCODONE HCL 5 MG/5ML PO SOLN: 5.0000 mg | Freq: Once | ORAL | Status: DC | PRN

## 2021-09-08 MED ORDER — DEXAMETHASONE SODIUM PHOSPHATE 10 MG/ML IJ SOLN
INTRAMUSCULAR | Status: DC | PRN
  Administered 2021-09-08: 10 mg via INTRAVENOUS

## 2021-09-08 MED ORDER — ONDANSETRON HCL 4 MG/2ML IJ SOLN
INTRAMUSCULAR | Status: DC | PRN
  Administered 2021-09-08: 4 mg via INTRAVENOUS

## 2021-09-08 MED ORDER — MIDAZOLAM HCL 2 MG/2ML IJ SOLN
INTRAMUSCULAR | Status: AC
  Filled 2021-09-08: qty 2

## 2021-09-08 MED ORDER — GLYCOPYRROLATE 0.2 MG/ML IJ SOLN
INTRAMUSCULAR | Status: DC | PRN
Start: 2021-09-08 — End: 2021-09-08
  Administered 2021-09-08: .2 mg via INTRAVENOUS

## 2021-09-08 MED ORDER — FAMOTIDINE 20 MG PO TABS
ORAL_TABLET | ORAL | Status: AC
  Administered 2021-09-08: 20 mg via ORAL
  Filled 2021-09-08: qty 1

## 2021-09-08 MED ORDER — POVIDONE-IODINE 10 % EX SWAB: 2.0000 "application " | Freq: Once | CUTANEOUS | Status: DC

## 2021-09-08 MED ORDER — ACETAMINOPHEN 10 MG/ML IV SOLN: INTRAVENOUS | Status: DC | PRN

## 2021-09-08 MED ORDER — PROMETHAZINE HCL 25 MG/ML IJ SOLN: 6.2500 mg | INTRAMUSCULAR | Status: DC | PRN

## 2021-09-08 MED ORDER — ACETAMINOPHEN 500 MG PO TABS
ORAL_TABLET | ORAL | Status: AC
  Administered 2021-09-08: 1000 mg via ORAL
  Filled 2021-09-08: qty 2

## 2021-09-08 MED ORDER — ACETAMINOPHEN 500 MG PO TABS: 1000.0000 mg | ORAL_TABLET | Freq: Once | ORAL | Status: AC

## 2021-09-08 MED ORDER — FENTANYL CITRATE (PF) 100 MCG/2ML IJ SOLN: 25.0000 ug | INTRAMUSCULAR | Status: DC | PRN

## 2021-09-08 SURGICAL SUPPLY — 22 items
BACTOSHIELD CHG 4% 4OZ (MISCELLANEOUS) ×1
CANISTER SUC SOCK COL 7IN (MISCELLANEOUS) ×2 IMPLANT
DRSG TELFA 3X8 NADH (GAUZE/BANDAGES/DRESSINGS) IMPLANT
GAUZE 4X4 16PLY ~~LOC~~+RFID DBL (SPONGE) ×2 IMPLANT
GLOVE SURG ENC MOIS LTX SZ8 (GLOVE) ×2 IMPLANT
GOWN STRL REUS W/ TWL LRG LVL3 (GOWN DISPOSABLE) ×1 IMPLANT
GOWN STRL REUS W/ TWL XL LVL3 (GOWN DISPOSABLE) ×1 IMPLANT
GOWN STRL REUS W/TWL LRG LVL3 (GOWN DISPOSABLE) ×2
GOWN STRL REUS W/TWL XL LVL3 (GOWN DISPOSABLE) ×2
HANDPIECE ABLA MINERVA ENDO (MISCELLANEOUS) ×1 IMPLANT
KIT PROCEDURE FLUENT (KITS) ×2 IMPLANT
KIT TURNOVER CYSTO (KITS) ×2 IMPLANT
MANIFOLD NEPTUNE II (INSTRUMENTS) ×2 IMPLANT
NS IRRIG 500ML POUR BTL (IV SOLUTION) ×1 IMPLANT
PACK DNC HYST (MISCELLANEOUS) ×2 IMPLANT
PAD DRESSING TELFA 3X8 NADH (GAUZE/BANDAGES/DRESSINGS) IMPLANT
PAD OB MATERNITY 4.3X12.25 (PERSONAL CARE ITEMS) ×2 IMPLANT
PAD PREP 24X41 OB/GYN DISP (PERSONAL CARE ITEMS) ×3 IMPLANT
SCRUB CHG 4% DYNA-HEX 4OZ (MISCELLANEOUS) ×1 IMPLANT
SEAL ROD LENS SCOPE MYOSURE (ABLATOR) ×2 IMPLANT
SET CYSTO W/LG BORE CLAMP LF (SET/KITS/TRAYS/PACK) IMPLANT
TOWEL OR 17X26 4PK STRL BLUE (TOWEL DISPOSABLE) ×2 IMPLANT

## 2021-09-08 NOTE — Op Note (Signed)
Operative Note   09/08/2021  PRE-OP DIAGNOSIS: Menorrhagia   POST-OP DIAGNOSIS: same   SURGEON: Annamarie Major, MD, FACOG   PROCEDURE: Procedure(s): DILATATION AND CURETTAGE /HYSTEROSCOPY with ENDOMETRIAL ABLATION  ANESTHESIA: General   ESTIMATED BLOOD LOSS: min   SPECIMENS: ECC, EMC   FLUID DEFICIT: min   COMPLICATIONS: none   DISPOSITION: PACU - hemodynamically stable.   CONDITION: stable   FINDINGS: Exam under anesthesia revealed small, mobile small uterus with no masses and bilateral adnexa without masses or fullness. Hysteroscopy revealed no polyps or fibroids, otherwise grossly normal appearing uterine cavity with bilateral tubal ostia and normal appearing endocervical canal.   PROCEDURE IN DETAIL: After informed consent was obtained, the patient was taken to the operating room where anesthesia was obtained without difficulty. The patient was positioned in the dorsal lithotomy position in [place] stirrups. The patient's bladder was catheterized with an in and out foley catheter. The patient was examined under anesthesia, with the above noted findings. The weightedspeculum was placed inside the patient's vagina, and the the anterior lip of the cervix was seen and grasped with the tenaculum. An Endocervical specimen was obtained with a kevorkian curette. The uterine cavity was sounded to 8.5 cm, and then the cervix was progressively dilated to a 16 French-Pratt dilator. The 30 degree hysteroscope was introduced, with LR fluid used to distend the intrauterine cavity, with the above noted findings.   The hystersocope was removed and the uterine cavity was curetted until a gritty texture was noted, yielding endometrial curettings. Repeat hysteroscopy performed, with improved contour and lining of uterus noted.  Excellent hemostasis was noted.   The Minerva endometrial ablation device was then placed without difficulty. Measurements were obtained. Patient was noted to have a uterine length  of 8.5 cm, a cervical length of 3 cm. The device is first tested and after confirmation the procedures performed. Length of procedure was 120 seconds. The ablation device is then removed and repeat hysteroscopy reveals an appropriate lining of the uterus and no perforation or injury. Hysteroscope is removed with minimal discrepancy of fluid.   Tenaculum was removed with excellent hemostasis noted. She was then taken out of dorsal lithotomy. Minimal discrepancy in fluid was noted. No bleeding.   The patient tolerated the procedure well. Sponge, lap and needle counts were correct x2. The patient was taken to recovery room in excellent condition.  Annamarie Major, MD, [facility], [facility] 09/08/2021  8:17 AM

## 2021-09-08 NOTE — Discharge Instructions (Signed)

## 2021-09-08 NOTE — Interval H&P Note (Signed)
History and Physical Interval Note:  09/08/2021 6:55 AM  Deanna Vaughn  has presented today for surgery, with the diagnosis of Irregular bleeding N92.6.  The various methods of treatment have been discussed with the patient and family. After consideration of risks, benefits and other options for treatment, the patient has consented to  Procedure(s): DILATATION AND CURETTAGE /HYSTEROSCOPY with ENDOMETRIAL ABLATION (N/A) as a surgical intervention.  The patient's history has been reviewed, patient examined, no change in status, stable for surgery.  I have reviewed the patient's chart and labs.  Questions were answered to the patient's satisfaction.     Letitia Libra

## 2021-09-08 NOTE — Transfer of Care (Signed)
Immediate Anesthesia Transfer of Care Note  Patient: Deanna Vaughn  Procedure(s) Performed: DILATATION AND CURETTAGE /HYSTEROSCOPY with ENDOMETRIAL ABLATION  Patient Location: PACU  Anesthesia Type:General  Level of Consciousness: drowsy  Airway & Oxygen Therapy: Patient Spontanous Breathing, Patient connected to face mask oxygen and oral airway in place  Post-op Assessment: Report given to RN and Post -op Vital signs reviewed and stable  Post vital signs: Reviewed and stable  Last Vitals:  Vitals Value Taken Time  BP 96/55 09/08/21 0817  Temp    Pulse 78 09/08/21 0821  Resp 14 09/08/21 0821  SpO2 100 % 09/08/21 0821  Vitals shown include unvalidated device data.  Last Pain:  Vitals:   09/08/21 0701  TempSrc: Oral  PainSc: 0-No pain         Complications: No notable events documented.

## 2021-09-08 NOTE — Anesthesia Postprocedure Evaluation (Signed)
Anesthesia Post Note  Patient: Deanna Vaughn  Procedure(s) Performed: DILATATION AND CURETTAGE /HYSTEROSCOPY with ENDOMETRIAL ABLATION  Patient location during evaluation: PACU Anesthesia Type: General Level of consciousness: awake and alert Pain management: pain level controlled Vital Signs Assessment: post-procedure vital signs reviewed and stable Respiratory status: spontaneous breathing, nonlabored ventilation and respiratory function stable Cardiovascular status: blood pressure returned to baseline and stable Postop Assessment: no apparent nausea or vomiting Anesthetic complications: no   No notable events documented.   Last Vitals:  Vitals:   09/08/21 0900 09/08/21 0912  BP: 119/82 (!) 141/75  Pulse: 78 (!) 51  Resp: 15 14  Temp: (!) 36.2 C (!) 36.1 C  SpO2: 98% 96%    Last Pain:  Vitals:   09/08/21 0912  TempSrc:   PainSc: 0-No pain                 Foye Deer

## 2021-09-08 NOTE — Anesthesia Procedure Notes (Signed)
Procedure Name: LMA Insertion Date/Time: 09/08/2021 7:40 AM Performed by: Loletha Grayer, CRNA Pre-anesthesia Checklist: Patient identified, Patient being monitored, Timeout performed, Emergency Drugs available and Suction available Patient Re-evaluated:Patient Re-evaluated prior to induction Oxygen Delivery Method: Circle system utilized Preoxygenation: Pre-oxygenation with 100% oxygen Induction Type: IV induction Ventilation: Mask ventilation without difficulty LMA: LMA inserted LMA Size: 4.0 Number of attempts: 1 Placement Confirmation: positive ETCO2 and breath sounds checked- equal and bilateral Tube secured with: Tape Dental Injury: Teeth and Oropharynx as per pre-operative assessment

## 2021-09-09 LAB — SURGICAL PATHOLOGY

## 2021-09-28 ENCOUNTER — Encounter: Payer: Self-pay | Admitting: Obstetrics & Gynecology

## 2021-09-28 ENCOUNTER — Telehealth (INDEPENDENT_AMBULATORY_CARE_PROVIDER_SITE_OTHER): Payer: Managed Care, Other (non HMO) | Admitting: Obstetrics & Gynecology

## 2021-09-28 DIAGNOSIS — Z9889 Other specified postprocedural states: Secondary | ICD-10-CM

## 2021-09-28 NOTE — Progress Notes (Signed)
Virtual Visit via Video Note ? ?I connected with Deanna Vaughn on 09/28/21 at  8:35 AM EST by a video enabled telemedicine application and verified that I am speaking with the correct person using two identifiers. ? ?Location: ?Patient: At work, private ?Provider: Office ?  ?I discussed the limitations of evaluation and management by telemedicine and the availability of in person appointments. The patient expressed understanding and agreed to proceed. ? ?History of Present Illness: ? ?Postoperative Follow-up ?Patient presents post op from operative hysteroscopy and endometrial ablation  for abnormal uterine bleeding, 2 weeks ago. ? ?Subjective: ?Patient reports marked improvement in her preop symptoms. Eating a regular diet without difficulty. The patient is not having any pain.  Activity: normal activities of daily living. Patient reports additional symptom's since surgery of No bleeding, including during the time of her period.  SHe has had some lingering light discharge. ? ?Observations/Objective: ?No exam today, due to telephone eVisit due to [facility] virus restriction on elective visits and procedures.  Prior visits reviewed along with ultrasounds/labs as indicated. ? ?Assessment: ?s/p :  operative hysteroscopy and endometrial ablation progressing well ? ?Plan: ?Patient has done well after surgery with no apparent complications.  I have discussed the post-operative course to date, and the expected progress moving forward.  The patient understands what complications to be concerned about.  I will see the patient in routine follow up, or sooner if needed.   ? ?Activity plan: No restriction..  Pelvic rest. ? ?Follow Up Instructions: ?As needed, and for annual exams ?  ?I discussed the assessment and treatment plan with the patient. The patient was provided an opportunity to ask questions and all were answered. The patient agreed with the plan and demonstrated an understanding of the instructions. ?  ?The patient  was advised to call back or seek an in-person evaluation if the symptoms worsen or if the condition fails to improve as anticipated. ? ?A total of 15 minutes were spent face-to-face with the patient as well as preparation, review, communication, and documentation during this encounter.  ? ?Annamarie Major, MD, FACOG ?[facility], [facility] ?09/28/2021  8:45 AM ? ?

## 2022-04-02 IMAGING — MG DIGITAL DIAGNOSTIC BILAT W/ TOMO W/ CAD
8 series · 8 of 24 positions shown · non-contrast
Comparison: None.

CLINICAL DATA: Clinically appreciated palpable area in the LEFT
upper outer breast.

EXAM:
DIGITAL DIAGNOSTIC BILATERAL MAMMOGRAM WITH TOMOSYNTHESIS AND CAD;
ULTRASOUND LEFT BREAST LIMITED
TECHNIQUE: Bilateral digital diagnostic mammography and breast tomosynthesis
was performed. The images were evaluated with computer-aided
detection.; Targeted ultrasound examination of the left breast was
performed.

[R MLO synth-2D]
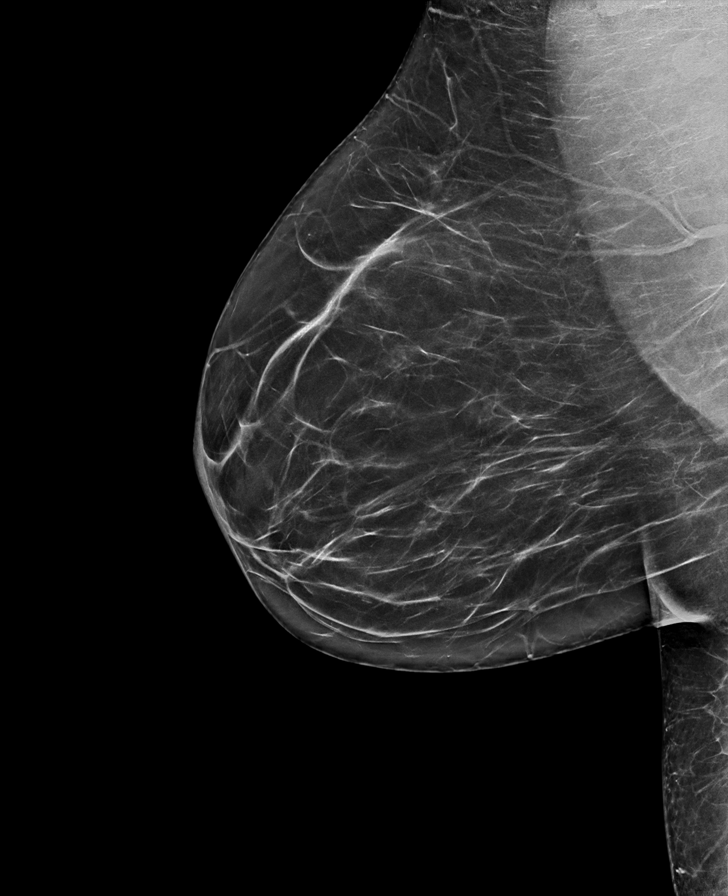

[R CC synth-2D]
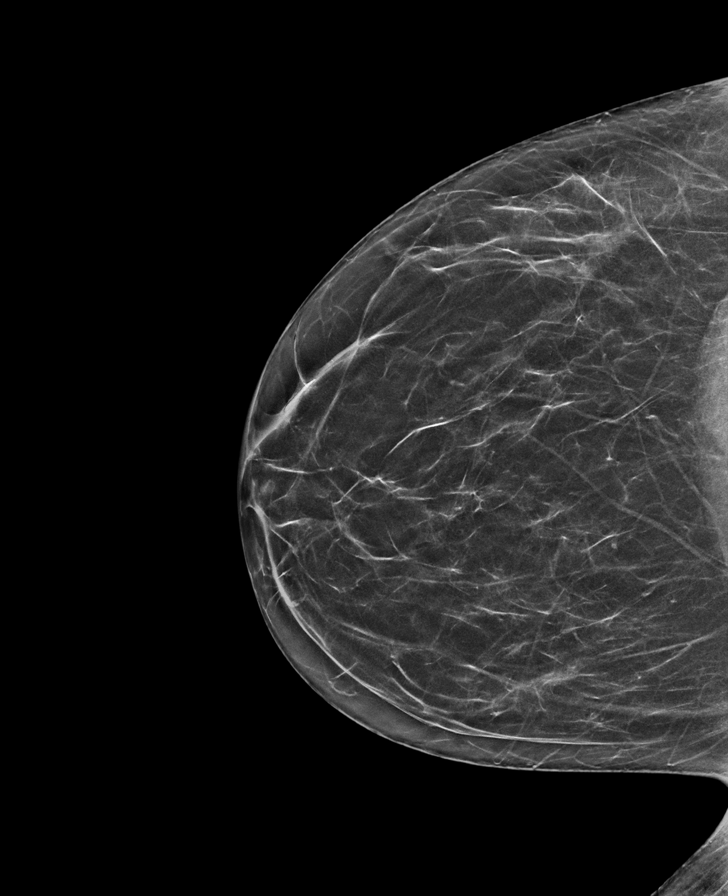

[L CC synth-2D]
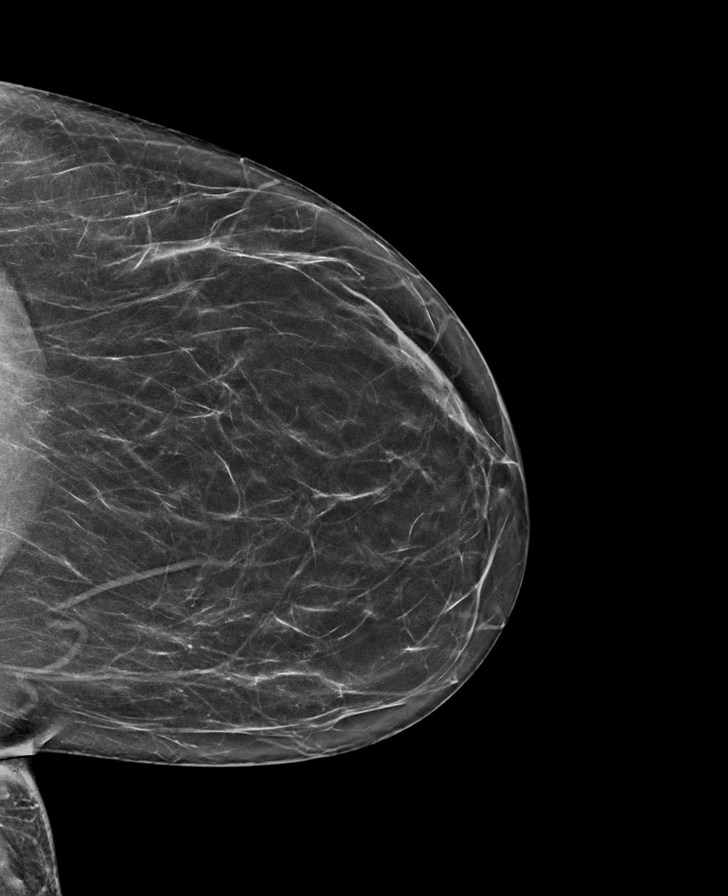

[L MLO synth-2D]
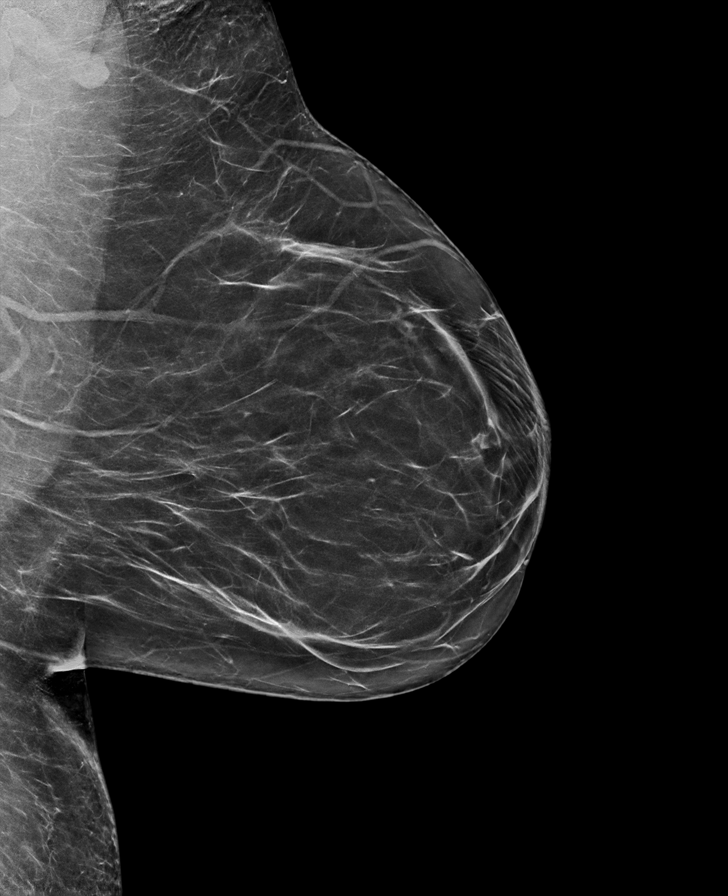

[R MLO tomo · tomo slice 37/73.0]
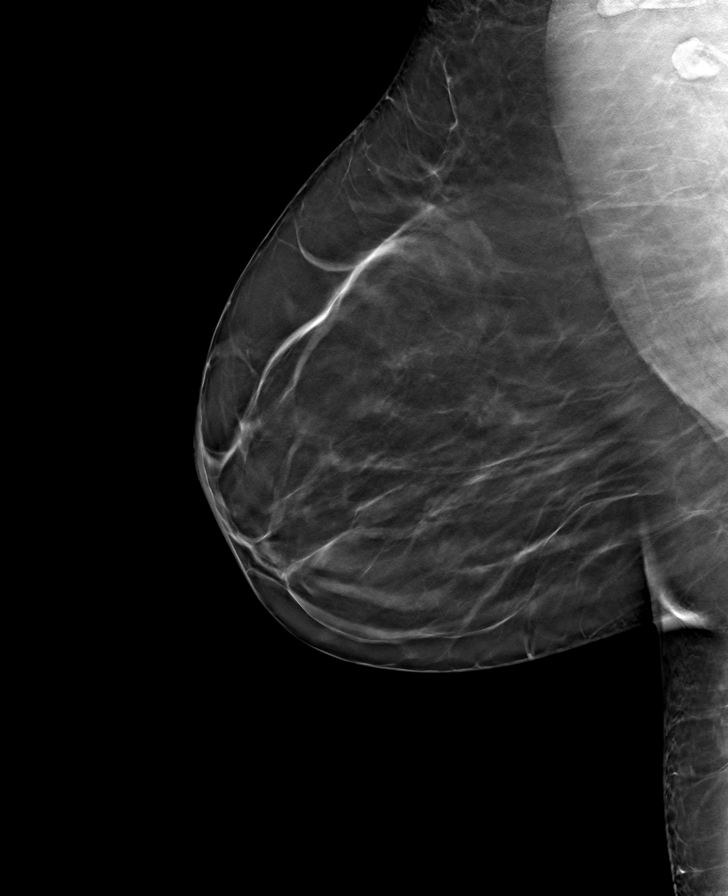

[L CC tomo · tomo slice 32/63.0]
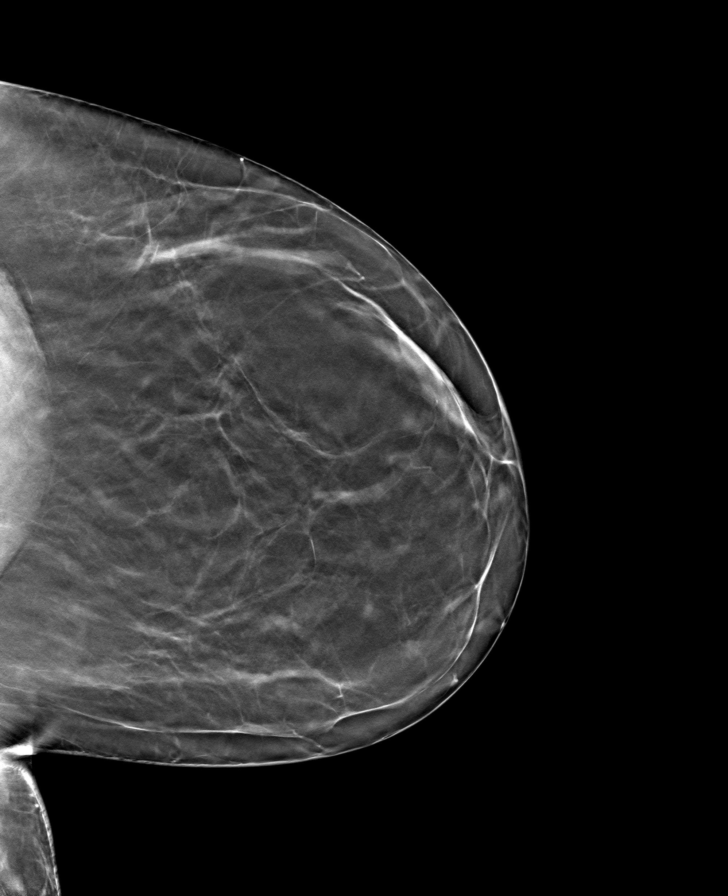

[R CC tomo · tomo slice 29/57.0]
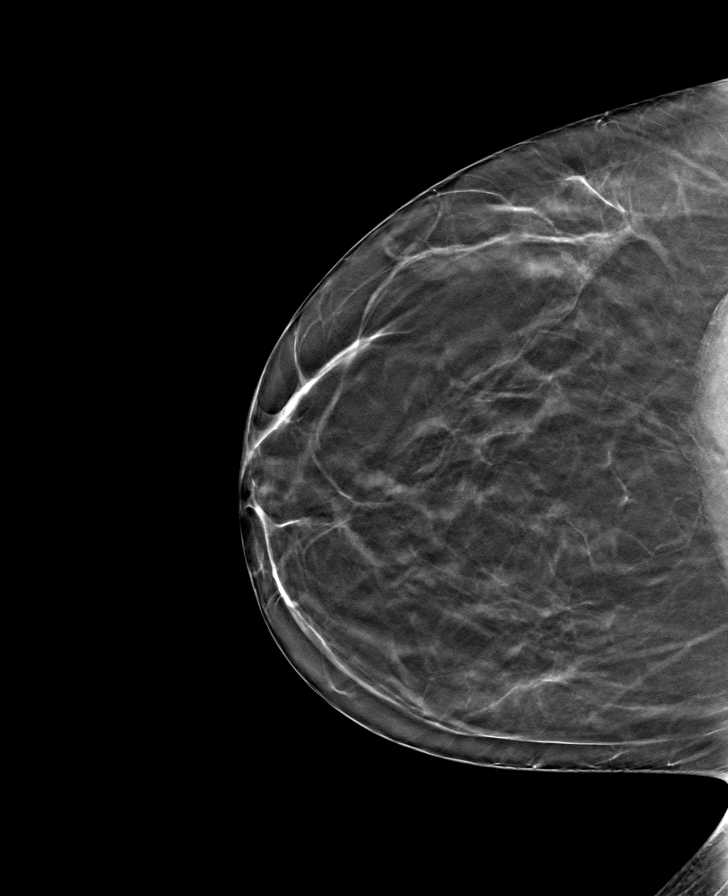

[L MLO tomo · tomo slice 37/73.0]
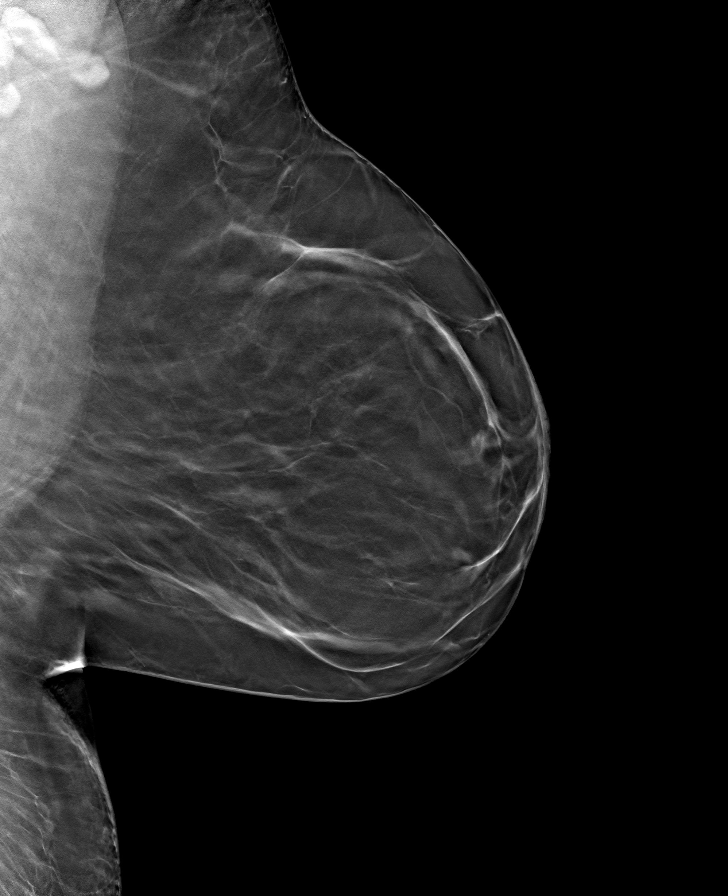

[8 of 24 positions shown; findings below may reference images not displayed]

Baseline

ACR Breast Density Category b: There are scattered areas of
fibroglandular density.
FINDINGS: Diagnostic views were obtained over the palpable area of concern in
the LEFT breast. No suspicious mammographic finding is identified in
this area. No suspicious mass, microcalcification, or other finding
is identified in either breast.

On physical exam, no suspicious mass is appreciated.

Targeted LEFT breast ultrasound was performed in the palpable area
of concern at the upper outer breast. No suspicious solid or cystic
mass is identified.
IMPRESSION: 1. No mammographic or sonographic evidence of malignancy at the site
of palpable concern in the LEFT upper outer breast. Any further
workup of the patient's symptoms should be based on the clinical
assessment. Recommend routine annual screening mammogram at the age
of 40 unless there are intervening clinical concerns.
2. No mammographic evidence of malignancy bilaterally.

RECOMMENDATION:
Screening mammogram at age 40 unless there are persistent or
intervening clinical concerns. (Code:Y2-S-Z3T)

I have discussed the findings and recommendations with the patient.
If applicable, a reminder letter will be sent to the patient
regarding the next appointment.

BI-RADS CATEGORY  1: Negative.

## 2022-10-23 NOTE — Progress Notes (Unsigned)
I,[name],acting as a scribe for Gwyneth Sprout, FNP.,have documented all relevant documentation on the behalf of Gwyneth Sprout, FNP,as directed by  Gwyneth Sprout, FNP while in the presence of Gwyneth Sprout, FNP.  New patient visit  Patient: Deanna Vaughn   DOB: [birth year]   32 y.o. Female  MRN: [id number] Visit Date: 10/24/2022  Today's healthcare provider: Gwyneth Sprout, FNP  Patient presents for new patient visit to establish care.  Introduced to Designer, jewellery role and practice setting.  All questions answered.  Discussed provider/patient relationship and expectations.  Chief Complaint  Patient presents with   Establish Care   Subjective    Deanna Vaughn is a 32 y.o. female who presents today as a new patient to establish care.  HPI  Patient has an upcoming OB/GYN appointment in April for Pap  Past Medical History:  Diagnosis Date   Anemia    Past Surgical History:  Procedure Laterality Date   CESAREAN SECTION  08/01/2007   CESAREAN SECTION WITH BILATERAL TUBAL LIGATION N/A 11/18/2015   Procedure: CESAREAN SECTION ;  Surgeon: Gae Dry, MD;  Location: [facility] ORS;  Service: Obstetrics;  Laterality: N/A;   CESAREAN SECTION WITH BILATERAL TUBAL LIGATION N/A 08/08/2018   Procedure: CESAREAN SECTION WITH BILATERAL TUBAL LIGATION;  Surgeon: Gae Dry, MD;  Location: [facility] ORS;  Service: Obstetrics;  Laterality: N/A;   CHOLECYSTECTOMY  2022   HYSTEROSCOPY WITH D & C N/A 09/08/2021   Procedure: DILATATION AND CURETTAGE /HYSTEROSCOPY with ENDOMETRIAL ABLATION;  Surgeon: Gae Dry, MD;  Location: [facility] ORS;  Service: Gynecology;  Laterality: N/A;   Family Status  Relation Name Status   Mother  Alive   Father  Alive   Sister  Alive   Daughter  Physicist, medical  (Not Specified)   Brother  Alive   Son  Alive   Son  Alive   Family History  Problem Relation Age of Onset   Hypertension Mother    Diabetes Mother    Breast cancer Maternal Grandmother         ? age   Social History   Socioeconomic History   Marital status: Significant Other    Spouse name: Not on file   Number of children: 3   Years of education: 12+   Highest education level: Some college, no degree  Occupational History   Not on file  Tobacco Use   Smoking status: Never   Smokeless tobacco: Never  Vaping Use   Vaping Use: Never used  Substance and Sexual Activity   Alcohol use: Not Currently   Drug use: Never   Sexual activity: Yes    Partners: Male    Birth control/protection: Surgical  Other Topics Concern   Not on file  Social History Narrative   Not on file   Social Determinants of Health   Financial Resource Strain: Not on file  Food Insecurity: Not on file  Transportation Needs: Not on file  Physical Activity: Not on file  Stress: Not on file  Social Connections: Not on file   Outpatient Medications Prior to Visit  Medication Sig   [DISCONTINUED] citalopram (CELEXA) 20 MG tablet Take 1 tablet (20 mg total) by mouth daily.   [DISCONTINUED] diclofenac (VOLTAREN) 75 MG EC tablet Take 1 tablet (75 mg total) by mouth 2 (two) times daily as needed.   [DISCONTINUED] fluticasone (FLONASE) 50 MCG/ACT nasal spray SPRAY 2 SPRAYS INTO EACH NOSTRIL EVERY DAY   [  DISCONTINUED] norethindrone (MICRONOR) 0.35 MG tablet Take 1 tablet (0.35 mg total) by mouth daily.   [DISCONTINUED] oxyCODONE-acetaminophen (PERCOCET/ROXICET) 5-325 MG tablet Take 1 tablet by mouth every 4 (four) hours as needed for moderate pain. (Patient not taking: Reported on 10/24/2022)   No facility-administered medications prior to visit.   No Known Allergies  Immunization History  Administered Date(s) Administered   Influenza-Unspecified 02/28/2017   Tdap 06/10/2018    Health Maintenance  Topic Date Due   COVID-19 Vaccine (1) Never done   Hepatitis C Screening  Never done   PAP SMEAR-Modifier  10/01/2022   INFLUENZA VACCINE  10/29/2022 (Originally 02/28/2022)   DTaP/Tdap/Td (2 - Td or  Tdap) 06/10/2028   HIV Screening  Completed   HPV VACCINES  Aged Out    Patient Care Team: Gwyneth Sprout, FNP as PCP - General (Family Medicine)  Review of Systems   Objective    BP 120/82 (BP Location: Right Arm, Patient Position: Sitting, Cuff Size: Large)   Pulse 84   Temp 99 F (37.2 C) (Oral)   Resp 13   Ht 5' (1.524 m)   Wt 181 lb 9.6 oz (82.4 kg)   SpO2 100%   BMI 35.47 kg/m   Physical Exam Vitals and nursing note reviewed.  Constitutional:      General: She is not in acute distress.    Appearance: Normal appearance. She is obese. She is not ill-appearing, toxic-appearing or diaphoretic.  HENT:     Head: Normocephalic and atraumatic.  Cardiovascular:     Rate and Rhythm: Normal rate and regular rhythm.     Pulses: Normal pulses.     Heart sounds: Normal heart sounds. No murmur heard.    No friction rub. No gallop.  Pulmonary:     Effort: Pulmonary effort is normal. No respiratory distress.     Breath sounds: Normal breath sounds. No stridor. No wheezing, rhonchi or rales.  Chest:     Chest wall: No tenderness.  Musculoskeletal:        General: No swelling, tenderness, deformity or signs of injury. Normal range of motion.     Cervical back: Normal range of motion and neck supple. No tenderness.     Right lower leg: Edema present.     Left lower leg: Edema present.     Comments: Nonpitting edema to bilateral shins; variable per pt report   Skin:    General: Skin is warm and dry.     Capillary Refill: Capillary refill takes less than 2 seconds.     Coloration: Skin is not jaundiced or pale.     Findings: No bruising, erythema, lesion or rash.  Neurological:     General: No focal deficit present.     Mental Status: She is alert and oriented to person, place, and time. Mental status is at baseline.     Cranial Nerves: No cranial nerve deficit.     Sensory: No sensory deficit.     Motor: No weakness.     Coordination: Coordination normal.  Psychiatric:         Mood and Affect: Mood normal.        Behavior: Behavior normal.        Thought Content: Thought content normal.        Judgment: Judgment normal.    Depression Screen    10/24/2022    1:50 PM 07/18/2021    9:16 AM 06/13/2021    1:47 PM  PHQ 2/9 Scores  PHQ -  2 Score 0 0 0  PHQ- 9 Score 5 2 2    No results found for any visits on 10/24/22.  Assessment & Plan      Problem List Items Addressed This Visit       Other   Class 2 obesity due to excess calories without serious comorbidity with body mass index (BMI) of 35.0 to 35.9 in adult - Primary    Chronic, stable Body mass index is 35.47 kg/m. Recommend labs as below      Relevant Orders   CBC with Differential/Platelet   Comprehensive Metabolic Panel (CMET)   TSH + free T4   Hemoglobin A1c   Lipid panel   Vitamin D (25 hydroxy)   B12 and Folate Panel   Hepatitis C Antibody   Encounter for hepatitis C screening test for low risk patient    Low risk screen Treatable, and curable. If left untreated Hep C can lead to cirrhosis and liver failure. Encourage routine testing; recommend repeat testing if risk factors change.       Relevant Orders   Hepatitis C Antibody   Encounter for vitamin deficiency screening    Recommend baseline labs given concern of fatigue on PHQ-9 and LE Edema, bilaterally       Relevant Orders   Vitamin D (25 hydroxy)   B12 and Folate Panel   Establishing care with new doctor, encounter for    Previously seen by GYN; no health concerns at this time       Family history of diabetes mellitus in mother    Recommend A1c given DM in mom; Continue to recommend balanced, lower carb meals. Smaller meal size, adding snacks. Choosing water as drink of choice and increasing purposeful exercise.       Relevant Orders   CBC with Differential/Platelet   Comprehensive Metabolic Panel (CMET)   TSH + free T4   Hemoglobin A1c   Lipid panel   Vitamin D (25 hydroxy)   B12 and Folate Panel    Hepatitis C Antibody   Family history of hypertension in mother    Recommend CBC, CMP, LP given HTN in mom and pt with LE edema      Relevant Orders   CBC with Differential/Platelet   Comprehensive Metabolic Panel (CMET)   TSH + free T4   Hemoglobin A1c   Lipid panel   Vitamin D (25 hydroxy)   B12 and Folate Panel   Hepatitis C Antibody   History of cesarean delivery    3 c-sections; 75 year old daughter, 26 and 67 year old sons      History of cholecystectomy    2022; denies concern with diet or digestion       Relevant Orders   Lipid panel   Positive screening for depression on 9-item Patient Health Questionnaire (PHQ-9)    Patient declines concerns at this time; continue to monitor. Previously noted with anxiety and use of celexa       Relevant Orders   TSH + free T4   Vitamin D (25 hydroxy)   B12 and Folate Panel   Return in about 6 months (around 04/26/2023) for annual examination.    Vonna Kotyk, FNP, have reviewed all documentation for this visit. The documentation on 10/24/22 for the exam, diagnosis, procedures, and orders are all accurate and complete.  Gwyneth Sprout, [place] [phone] (phone) [phone] (fax)  [place]

## 2022-10-24 ENCOUNTER — Encounter: Payer: Self-pay | Admitting: Family Medicine

## 2022-10-24 ENCOUNTER — Ambulatory Visit (INDEPENDENT_AMBULATORY_CARE_PROVIDER_SITE_OTHER): Payer: Managed Care, Other (non HMO) | Admitting: Family Medicine

## 2022-10-24 VITALS — BP 120/82 | HR 84 | Temp 99.0°F | Resp 13 | Ht 60.0 in | Wt 181.6 lb

## 2022-10-24 DIAGNOSIS — Z1159 Encounter for screening for other viral diseases: Secondary | ICD-10-CM | POA: Insufficient documentation

## 2022-10-24 DIAGNOSIS — Z1321 Encounter for screening for nutritional disorder: Secondary | ICD-10-CM | POA: Insufficient documentation

## 2022-10-24 DIAGNOSIS — Z8249 Family history of ischemic heart disease and other diseases of the circulatory system: Secondary | ICD-10-CM | POA: Insufficient documentation

## 2022-10-24 DIAGNOSIS — Z1331 Encounter for screening for depression: Secondary | ICD-10-CM | POA: Insufficient documentation

## 2022-10-24 DIAGNOSIS — E6609 Other obesity due to excess calories: Secondary | ICD-10-CM | POA: Diagnosis not present

## 2022-10-24 DIAGNOSIS — Z98891 History of uterine scar from previous surgery: Secondary | ICD-10-CM | POA: Diagnosis not present

## 2022-10-24 DIAGNOSIS — Z833 Family history of diabetes mellitus: Secondary | ICD-10-CM | POA: Insufficient documentation

## 2022-10-24 DIAGNOSIS — Z9049 Acquired absence of other specified parts of digestive tract: Secondary | ICD-10-CM | POA: Diagnosis not present

## 2022-10-24 DIAGNOSIS — Z7689 Persons encountering health services in other specified circumstances: Secondary | ICD-10-CM | POA: Insufficient documentation

## 2022-10-24 DIAGNOSIS — Z6835 Body mass index (BMI) 35.0-35.9, adult: Secondary | ICD-10-CM

## 2022-10-24 NOTE — Assessment & Plan Note (Signed)
3 c-sections; 34 year old daughter, 20 and 69 year old sons

## 2022-10-24 NOTE — Assessment & Plan Note (Signed)
Previously seen by GYN; no health concerns at this time

## 2022-10-24 NOTE — Assessment & Plan Note (Signed)
Recommend baseline labs given concern of fatigue on PHQ-9 and LE Edema, bilaterally

## 2022-10-24 NOTE — Assessment & Plan Note (Signed)
2022; denies concern with diet or digestion

## 2022-10-24 NOTE — Assessment & Plan Note (Signed)
Chronic, stable Body mass index is 35.47 kg/m. Recommend labs as below

## 2022-10-24 NOTE — Assessment & Plan Note (Signed)
Low risk screen Treatable, and curable. If left untreated Hep C can lead to cirrhosis and liver failure. Encourage routine testing; recommend repeat testing if risk factors change.  

## 2022-10-24 NOTE — Assessment & Plan Note (Signed)
Recommend CBC, CMP, LP given HTN in mom and pt with LE edema

## 2022-10-24 NOTE — Assessment & Plan Note (Signed)
Recommend A1c given DM in mom; Continue to recommend balanced, lower carb meals. Smaller meal size, adding snacks. Choosing water as drink of choice and increasing purposeful exercise.

## 2022-10-24 NOTE — Assessment & Plan Note (Signed)
Patient declines concerns at this time; continue to monitor. Previously noted with anxiety and use of celexa

## 2022-10-25 LAB — CBC WITH DIFFERENTIAL/PLATELET
Basophils Absolute: 0 10*3/uL (ref 0.0–0.2)
Basos: 0 %
EOS (ABSOLUTE): 0.2 10*3/uL (ref 0.0–0.4)
Eos: 2 %
Hematocrit: 36.1 % (ref 34.0–46.6)
Hemoglobin: 12.1 g/dL (ref 11.1–15.9)
Immature Grans (Abs): 0.1 10*3/uL (ref 0.0–0.1)
Immature Granulocytes: 1 %
Lymphocytes Absolute: 2.4 10*3/uL (ref 0.7–3.1)
Lymphs: 25 %
MCH: 30.1 pg (ref 26.6–33.0)
MCHC: 33.5 g/dL (ref 31.5–35.7)
MCV: 90 fL (ref 79–97)
Monocytes Absolute: 0.4 10*3/uL (ref 0.1–0.9)
Monocytes: 4 %
Neutrophils Absolute: 6.6 10*3/uL (ref 1.4–7.0)
Neutrophils: 68 %
Platelets: 282 10*3/uL (ref 150–450)
RBC: 4.02 x10E6/uL (ref 3.77–5.28)
RDW: 12.1 % (ref 11.7–15.4)
WBC: 9.8 10*3/uL (ref 3.4–10.8)

## 2022-10-25 LAB — COMPREHENSIVE METABOLIC PANEL
ALT: 7 IU/L (ref 0–32)
AST: 12 IU/L (ref 0–40)
Albumin/Globulin Ratio: 1.4 (ref 1.2–2.2)
Albumin: 4.1 g/dL (ref 3.9–4.9)
Alkaline Phosphatase: 34 IU/L — ABNORMAL LOW (ref 44–121)
BUN/Creatinine Ratio: 13 (ref 9–23)
BUN: 10 mg/dL (ref 6–20)
Bilirubin Total: 0.3 mg/dL (ref 0.0–1.2)
CO2: 25 mmol/L (ref 20–29)
Calcium: 9.8 mg/dL (ref 8.7–10.2)
Chloride: 101 mmol/L (ref 96–106)
Creatinine, Ser: 0.76 mg/dL (ref 0.57–1.00)
Globulin, Total: 3 g/dL (ref 1.5–4.5)
Glucose: 84 mg/dL (ref 70–99)
Potassium: 4.1 mmol/L (ref 3.5–5.2)
Sodium: 142 mmol/L (ref 134–144)
Total Protein: 7.1 g/dL (ref 6.0–8.5)
eGFR: 106 mL/min/{1.73_m2} (ref >59)

## 2022-10-25 LAB — HEMOGLOBIN A1C
Est. average glucose Bld gHb Est-mCnc: 105 mg/dL
Hgb A1c MFr Bld: 5.3 % (ref 4.8–5.6)

## 2022-10-25 LAB — LIPID PANEL
Chol/HDL Ratio: 2.6 ratio (ref 0.0–4.4)
Cholesterol, Total: 197 mg/dL (ref 100–199)
HDL: 75 mg/dL (ref >39)
LDL Chol Calc (NIH): 110 mg/dL — ABNORMAL HIGH (ref 0–99)
Triglycerides: 66 mg/dL (ref 0–149)
VLDL Cholesterol Cal: 12 mg/dL (ref 5–40)

## 2022-10-25 LAB — TSH+FREE T4
Free T4: 1.07 ng/dL (ref 0.82–1.77)
TSH: 0.959 u[IU]/mL (ref 0.450–4.500)

## 2022-10-25 LAB — HEPATITIS C ANTIBODY: Hep C Virus Ab: NONREACTIVE

## 2022-10-25 LAB — B12 AND FOLATE PANEL
Folate: 7.3 ng/mL (ref >3.0)
Vitamin B-12: 393 pg/mL (ref 232–1245)

## 2022-10-25 LAB — VITAMIN D 25 HYDROXY (VIT D DEFICIENCY, FRACTURES): Vit D, 25-Hydroxy: 12.5 ng/mL — ABNORMAL LOW (ref 30.0–100.0)

## 2022-10-25 NOTE — Progress Notes (Signed)
It was nice to meet you; your labs have been reviewed and summarized. Please let us know if you have any questions or concerns. -normal blood chemistry -borderline elevation in bad/LDL cholesterol. I continue to recommend lifestyle management with lower fat intake in diet and routine exercise. -low vit D levels; can add 5000 IU daily OTC to assist with plan to recheck labs in 3-6 months. Also, if preferred can send in 50,000 IU weekly with same follow up. -anemia has stabilized -normal A1c; no evidence of pre-diabetes or T2 Diabetes  -normal Vit B 12 and folate -non reactive infectious disease; no need to repeat unless risk factors change. These are completed once in the lifetime of an adult patient. -normal thyroid. Gwyneth Sprout, [address] [place], [place] [address] [phone] (phone) [phone] (fax) [place]

## 2022-11-26 NOTE — Progress Notes (Signed)
PCP:  Jacky Kindle, FNP   No chief complaint on file.    HPI:      Deanna Vaughn is a 32 y.o. [phone] whose LMP was No LMP recorded., presents today for her annual examination.  Her menses are {norm/abn:715}, lasting {number: 22536} days.  Dysmenorrhea {dysmen:716}. She {does:18564} have intermenstrual bleeding.  Sex activity: {sex active: 315163}.  Last Pap: 10/01/19  Results were: no abnormalities /neg HPV DNA  Hx of STDs: {STD hx:14358}  There is no FH of breast cancer. There is no FH of ovarian cancer. The patient {does:18564} do self-breast exams.  Tobacco use: {tob:20664} Alcohol use: {Alcohol:11675} No drug use.  Exercise: {exercise:31265}  She {does:18564} get adequate calcium and Vitamin D in her diet.  Patient Active Problem List   Diagnosis Date Noted   Encounter for hepatitis C screening test for low risk patient 10/24/2022   Establishing care with new doctor, encounter for 10/24/2022   Class 2 obesity due to excess calories without serious comorbidity with body mass index (BMI) of 35.0 to 35.9 in adult 10/24/2022   History of cholecystectomy 10/24/2022   Encounter for vitamin deficiency screening 10/24/2022   Family history of diabetes mellitus in mother 10/24/2022   Family history of hypertension in mother 10/24/2022   Positive screening for depression on 9-item Patient Health Questionnaire (PHQ-9) 10/24/2022   S/P endometrial ablation 09/28/2021   Status post laparoscopic cholecystectomy 01/06/2021   History of cesarean delivery 11/18/2015    Past Surgical History:  Procedure Laterality Date   CESAREAN SECTION  08/01/2007   CESAREAN SECTION WITH BILATERAL TUBAL LIGATION N/A 11/18/2015   Procedure: CESAREAN SECTION ;  Surgeon: Nadara Mustard, MD;  Location: [facility] ORS;  Service: Obstetrics;  Laterality: N/A;   CESAREAN SECTION WITH BILATERAL TUBAL LIGATION N/A 08/08/2018   Procedure: CESAREAN SECTION WITH BILATERAL TUBAL LIGATION;  Surgeon: Nadara Mustard, MD;  Location: [facility] ORS;  Service: Obstetrics;  Laterality: N/A;   CHOLECYSTECTOMY  2022   HYSTEROSCOPY WITH D & C N/A 09/08/2021   Procedure: DILATATION AND CURETTAGE /HYSTEROSCOPY with ENDOMETRIAL ABLATION;  Surgeon: Nadara Mustard, MD;  Location: [facility] ORS;  Service: Gynecology;  Laterality: N/A;    Family History  Problem Relation Age of Onset   Hypertension Mother    Diabetes Mother    Breast cancer Maternal Grandmother        ? age    Social History   Socioeconomic History   Marital status: Significant Other    Spouse name: Not on file   Number of children: 3   Years of education: 12+   Highest education level: Some college, no degree  Occupational History   Not on file  Tobacco Use   Smoking status: Never   Smokeless tobacco: Never  Vaping Use   Vaping Use: Never used  Substance and Sexual Activity   Alcohol use: Not Currently   Drug use: Never   Sexual activity: Yes    Partners: Male    Birth control/protection: Surgical  Other Topics Concern   Not on file  Social History Narrative   Not on file   Social Determinants of Health   Financial Resource Strain: Not on file  Food Insecurity: Not on file  Transportation Needs: Not on file  Physical Activity: Not on file  Stress: Not on file  Social Connections: Not on file  Intimate Partner Violence: Not on file    No current outpatient medications on file.  ROS:  Review of Systems BREAST: No symptoms   Objective: There were no vitals taken for this visit.   OBGyn Exam  Results: No results found for this or any previous visit (from the past 24 hour(s)).  Assessment/Plan: No diagnosis found.  No orders of the defined types were placed in this encounter.            GYN counsel {counseling: 16159}     F/U  No follow-ups on file.  Mourad Cwikla B. Torell Minder, PA-C 11/26/2022 6:07 PM

## 2022-11-27 ENCOUNTER — Ambulatory Visit (INDEPENDENT_AMBULATORY_CARE_PROVIDER_SITE_OTHER): Payer: Managed Care, Other (non HMO) | Admitting: Obstetrics and Gynecology

## 2022-11-27 ENCOUNTER — Encounter: Payer: Self-pay | Admitting: Obstetrics and Gynecology

## 2022-11-27 VITALS — BP 120/84 | Ht 60.0 in | Wt 188.0 lb

## 2022-11-27 DIAGNOSIS — N939 Abnormal uterine and vaginal bleeding, unspecified: Secondary | ICD-10-CM

## 2022-11-27 DIAGNOSIS — Z01419 Encounter for gynecological examination (general) (routine) without abnormal findings: Secondary | ICD-10-CM

## 2022-11-27 DIAGNOSIS — Z124 Encounter for screening for malignant neoplasm of cervix: Secondary | ICD-10-CM

## 2022-11-27 DIAGNOSIS — Z1151 Encounter for screening for human papillomavirus (HPV): Secondary | ICD-10-CM

## 2022-11-27 DIAGNOSIS — N941 Unspecified dyspareunia: Secondary | ICD-10-CM

## 2022-11-27 NOTE — Patient Instructions (Signed)
I value your feedback and you entrusting us with your care. If you get a [facility] patient survey, I would appreciate you taking the time to let us know about your experience today. Thank you! ? ? ?

## 2022-12-02 LAB — IGP, APTIMA HPV: HPV Aptima: NEGATIVE

## 2022-12-04 ENCOUNTER — Other Ambulatory Visit: Payer: Managed Care, Other (non HMO)

## 2022-12-18 ENCOUNTER — Ambulatory Visit: Payer: Managed Care, Other (non HMO)

## 2022-12-18 ENCOUNTER — Other Ambulatory Visit: Payer: Self-pay | Admitting: Obstetrics and Gynecology

## 2022-12-18 DIAGNOSIS — N939 Abnormal uterine and vaginal bleeding, unspecified: Secondary | ICD-10-CM | POA: Diagnosis not present

## 2022-12-18 DIAGNOSIS — N941 Unspecified dyspareunia: Secondary | ICD-10-CM

## 2022-12-18 DIAGNOSIS — Z01419 Encounter for gynecological examination (general) (routine) without abnormal findings: Secondary | ICD-10-CM

## 2022-12-18 DIAGNOSIS — Z124 Encounter for screening for malignant neoplasm of cervix: Secondary | ICD-10-CM

## 2022-12-18 DIAGNOSIS — Z1151 Encounter for screening for human papillomavirus (HPV): Secondary | ICD-10-CM

## 2022-12-21 ENCOUNTER — Telehealth: Payer: Self-pay | Admitting: Obstetrics and Gynecology

## 2022-12-21 DIAGNOSIS — N939 Abnormal uterine and vaginal bleeding, unspecified: Secondary | ICD-10-CM

## 2022-12-21 MED ORDER — ETONOGESTREL-ETHINYL ESTRADIOL 0.12-0.015 MG/24HR VA RING: VAGINAL_RING | VAGINAL | 3 refills | Status: DC

## 2022-12-21 NOTE — Telephone Encounter (Signed)
Pt aware of neg GYN u/s results for AUB. S/p ablation for AUB without sx control. Neg labs 3/24. Would like to do nuvaring again. S/p TL. Rx eRxd. Will take a few months for cycle control. F/u prn.

## 2022-12-26 ENCOUNTER — Telehealth: Payer: Self-pay

## 2022-12-26 NOTE — Telephone Encounter (Signed)
Spoke with pharmacist. Advised refill denied. Patient was seen 12/21/22, rx changed to Nuvaring.

## 2023-04-12 ENCOUNTER — Telehealth: Payer: Managed Care, Other (non HMO) | Admitting: Physician Assistant

## 2023-04-12 DIAGNOSIS — R3989 Other symptoms and signs involving the genitourinary system: Secondary | ICD-10-CM

## 2023-04-12 MED ORDER — CEPHALEXIN 500 MG PO CAPS: 500.0000 mg | ORAL_CAPSULE | Freq: Two times a day (BID) | ORAL | 0 refills | Status: AC

## 2023-04-12 NOTE — Progress Notes (Signed)

## 2023-04-12 NOTE — Progress Notes (Signed)
I have spent 5 minutes in review of e-visit questionnaire, review and updating patient chart, medical decision making and response to patient.   William Cody Martin, PA-C    

## 2023-04-30 ENCOUNTER — Encounter: Payer: Self-pay | Admitting: Family Medicine

## 2023-04-30 ENCOUNTER — Ambulatory Visit (INDEPENDENT_AMBULATORY_CARE_PROVIDER_SITE_OTHER): Payer: Managed Care, Other (non HMO) | Admitting: Family Medicine

## 2023-04-30 VITALS — BP 116/84 | HR 85 | Ht 60.0 in | Wt 188.6 lb

## 2023-04-30 DIAGNOSIS — N951 Menopausal and female climacteric states: Secondary | ICD-10-CM | POA: Insufficient documentation

## 2023-04-30 DIAGNOSIS — E559 Vitamin D deficiency, unspecified: Secondary | ICD-10-CM | POA: Insufficient documentation

## 2023-04-30 DIAGNOSIS — E7800 Pure hypercholesterolemia, unspecified: Secondary | ICD-10-CM | POA: Insufficient documentation

## 2023-04-30 DIAGNOSIS — Z0000 Encounter for general adult medical examination without abnormal findings: Secondary | ICD-10-CM | POA: Diagnosis not present

## 2023-04-30 DIAGNOSIS — E66812 Obesity, class 2: Secondary | ICD-10-CM | POA: Insufficient documentation

## 2023-04-30 DIAGNOSIS — Z6836 Body mass index (BMI) 36.0-36.9, adult: Secondary | ICD-10-CM

## 2023-04-30 NOTE — Assessment & Plan Note (Signed)
Acute on chronic, reports irregular periods with use of nuva ring Reports temperature fluctuations during day and night as well as sleep concerns Also notes mood concerns related to fatigue Recommend hormonal labs and plan to f/u with GYN provider to further assist

## 2023-04-30 NOTE — Assessment & Plan Note (Signed)
Chronic, weight gain noted Body mass index is 36.83 kg/m. Continue to recommend balanced, lower carb meals. Smaller meal size, adding snacks. Choosing water as drink of choice and increasing purposeful exercise. Recommend A1c

## 2023-04-30 NOTE — Assessment & Plan Note (Signed)
Chronic, repeat labs Mood disorder noted thought to be 2/2 perimenopausal vasomotor symptoms Declines medication start like paxil to assist given c/f weight gain

## 2023-04-30 NOTE — Assessment & Plan Note (Signed)
Chronic, repeat LP recommend diet low in saturated fat and regular exercise - 30 min at least 5 times per week Not on medication at this time

## 2023-04-30 NOTE — Assessment & Plan Note (Signed)

## 2023-04-30 NOTE — Progress Notes (Signed)
Complete physical exam  Patient: Deanna Vaughn   DOB: 03-07-1991   32 y.o. Female  MRN: [id number] Visit Date: 04/30/2023  Today's healthcare provider: Jacky Kindle, FNP  Re-introduced to nurse practitioner role and practice setting.  All questions answered.  Discussed provider/patient relationship and expectations.  Chief Complaint  Patient presents with   Annual Exam    Diet - General Exercise- none Feeling - well Sleeping - fairly well Concern - none   Subjective     HPI HPI     Annual Exam    Additional comments: Diet - General Exercise- none Feeling - well Sleeping - fairly well Concern - none      Last edited by Acey Lav, CMA on 04/30/2023  9:11 AM.      Past Medical History:  Diagnosis Date   Anemia    Past Surgical History:  Procedure Laterality Date   CESAREAN SECTION  08/01/2007   CESAREAN SECTION WITH BILATERAL TUBAL LIGATION N/A 11/18/2015   Procedure: CESAREAN SECTION ;  Surgeon: Nadara Mustard, MD;  Location: [facility] ORS;  Service: Obstetrics;  Laterality: N/A;   CESAREAN SECTION WITH BILATERAL TUBAL LIGATION N/A 08/08/2018   Procedure: CESAREAN SECTION WITH BILATERAL TUBAL LIGATION;  Surgeon: Nadara Mustard, MD;  Location: [facility];  Service: Obstetrics;  Laterality: N/A;   CHOLECYSTECTOMY  2022   HYSTEROSCOPY WITH D & C N/A 09/08/2021   Procedure: DILATATION AND CURETTAGE /HYSTEROSCOPY with ENDOMETRIAL ABLATION;  Surgeon: Nadara Mustard, MD;  Location: [facility];  Service: Gynecology;  Laterality: N/A;   Social History   Socioeconomic History   Marital status: Significant Other    Spouse name: Not on file   Number of children: 3   Years of education: 12+   Highest education level: Some college, no degree  Occupational History   Not on file  Tobacco Use   Smoking status: Never   Smokeless tobacco: Never  Vaping Use   Vaping status: Never Used  Substance and Sexual Activity   Alcohol use: Not Currently   Drug use: No    Sexual activity: Yes    Partners: Male    Birth control/protection: Inserts, Surgical    Comment: Tubal Ligation  Other Topics Concern   Not on file  Social History Narrative   Not on file   Social Determinants of Health   Financial Resource Strain: Not on file  Food Insecurity: Not on file  Transportation Needs: Not on file  Physical Activity: Not on file  Stress: Not on file  Social Connections: Not on file  Intimate Partner Violence: Not on file   Family Status  Relation Name Status   Mother Jordan walker Alive   Father  Alive   Sister  Alive   Brother  Alive   [facility]  Deceased   Daughter  Alive   Son  Alive   Son  Alive   Mat Aunt Margret daye (Not Specified)  No partnership data on file   Family History  Problem Relation Age of Onset   Hypertension Mother    Diabetes Mother    Breast cancer Maternal Grandmother        52s   Cancer Maternal Aunt    No Known Allergies  Patient Care Team: Jacky Kindle, FNP as PCP - General (Family Medicine)   Medications: Outpatient Medications Prior to Visit  Medication Sig   etonogestrel-ethinyl estradiol (NUVARING) 0.12-0.015 MG/24HR vaginal ring Insert vaginally and leave in place for  3 consecutive weeks, then remove for 1 week.   No facility-administered medications prior to visit.    Review of Systems   Objective    BP 116/84 (BP Location: Right Arm, Patient Position: Sitting, Cuff Size: Normal)   Pulse 85   Ht 5' (1.524 m)   Wt 188 lb 9.6 oz (85.5 kg)   SpO2 100%   BMI 36.83 kg/m   Physical Exam Vitals and nursing note reviewed.  Constitutional:      General: She is awake. She is not in acute distress.    Appearance: Normal appearance. She is well-developed and well-groomed. She is obese. She is not ill-appearing, toxic-appearing or diaphoretic.  HENT:     Head: Normocephalic and atraumatic.     Jaw: There is normal jaw occlusion. No trismus, tenderness, swelling or pain on movement.     Right Ear:  Hearing, tympanic membrane, ear canal and external ear normal. There is no impacted cerumen.     Left Ear: Hearing, tympanic membrane, ear canal and external ear normal. There is no impacted cerumen.     Nose: Nose normal. No congestion or rhinorrhea.     Right Turbinates: Not enlarged, swollen or pale.     Left Turbinates: Not enlarged, swollen or pale.     Right Sinus: No maxillary sinus tenderness or frontal sinus tenderness.     Left Sinus: No maxillary sinus tenderness or frontal sinus tenderness.     Mouth/Throat:     Lips: Pink.     Mouth: Mucous membranes are moist. No injury.     Tongue: No lesions.     Pharynx: Oropharynx is clear. Uvula midline. No pharyngeal swelling, oropharyngeal exudate, posterior oropharyngeal erythema or uvula swelling.     Tonsils: No tonsillar exudate or tonsillar abscesses.  Eyes:     General: Lids are normal. Lids are everted, no foreign bodies appreciated. Vision grossly intact. Gaze aligned appropriately. No allergic shiner or visual field deficit.       Right eye: No discharge.        Left eye: No discharge.     Extraocular Movements: Extraocular movements intact.     Conjunctiva/sclera: Conjunctivae normal.     Right eye: Right conjunctiva is not injected. No exudate.    Left eye: Left conjunctiva is not injected. No exudate.    Pupils: Pupils are equal, round, and reactive to light.  Neck:     Thyroid: No thyroid mass, thyromegaly or thyroid tenderness.     Vascular: No carotid bruit.     Trachea: Trachea normal.  Cardiovascular:     Rate and Rhythm: Normal rate and regular rhythm.     Pulses: Normal pulses.          Carotid pulses are 2+ on the right side and 2+ on the left side.      Radial pulses are 2+ on the right side and 2+ on the left side.       Dorsalis pedis pulses are 2+ on the right side and 2+ on the left side.       Posterior tibial pulses are 2+ on the right side and 2+ on the left side.     Heart sounds: Normal heart sounds,  S1 normal and S2 normal. No murmur heard.    No friction rub. No gallop.  Pulmonary:     Effort: Pulmonary effort is normal. No respiratory distress.     Breath sounds: Normal breath sounds and air entry. No stridor. No wheezing,  rhonchi or rales.  Chest:     Chest wall: No tenderness.  Abdominal:     General: Abdomen is flat. Bowel sounds are normal. There is no distension.     Palpations: Abdomen is soft. There is no mass.     Tenderness: There is no abdominal tenderness. There is no right CVA tenderness, left CVA tenderness, guarding or rebound.     Hernia: No hernia is present.  Genitourinary:    Comments: Exam deferred; denies complaints Musculoskeletal:        General: No swelling, tenderness, deformity or signs of injury. Normal range of motion.     Cervical back: Full passive range of motion without pain, normal range of motion and neck supple. No edema, rigidity or tenderness. No muscular tenderness.     Right lower leg: No edema.     Left lower leg: No edema.  Lymphadenopathy:     Cervical: No cervical adenopathy.     Right cervical: No superficial, deep or posterior cervical adenopathy.    Left cervical: No superficial, deep or posterior cervical adenopathy.  Skin:    General: Skin is warm and dry.     Capillary Refill: Capillary refill takes less than 2 seconds.     Coloration: Skin is not jaundiced or pale.     Findings: No bruising, erythema, lesion or rash.  Neurological:     General: No focal deficit present.     Mental Status: She is alert and oriented to person, place, and time. Mental status is at baseline.     GCS: GCS eye subscore is 4. GCS verbal subscore is 5. GCS motor subscore is 6.     Sensory: Sensation is intact. No sensory deficit.     Motor: Motor function is intact. No weakness.     Coordination: Coordination is intact. Coordination normal.     Gait: Gait is intact. Gait normal.  Psychiatric:        Attention and Perception: Attention and perception  normal.        Mood and Affect: Mood and affect normal.        Speech: Speech normal.        Behavior: Behavior normal. Behavior is cooperative.        Thought Content: Thought content normal.        Cognition and Memory: Cognition and memory normal.        Judgment: Judgment normal.     Last depression screening scores    04/30/2023    9:10 AM 10/24/2022    1:50 PM 07/18/2021    9:16 AM  PHQ 2/9 Scores  PHQ - 2 Score 0 0 0  PHQ- 9 Score 8 5 2    Last fall risk screening    04/30/2023    9:11 AM  Fall Risk   Falls in the past year? 0  Injury with Fall? 0  Risk for fall due to : No Fall Risks  Follow up Falls evaluation completed   Last Audit-C alcohol use screening    10/24/2022    1:50 PM  Alcohol Use Disorder Test (AUDIT)  1. How often do you have a drink containing alcohol? 0   A score of 3 or more in women, and 4 or more in men indicates increased risk for alcohol abuse, EXCEPT if all of the points are from question 1   No results found for any visits on 04/30/23.  Assessment & Plan    Routine Health Maintenance and Physical Exam  Exercise Activities and Dietary recommendations  Goals   None     Immunization History  Administered Date(s) Administered   Influenza-Unspecified 02/28/2017   Tdap 06/10/2018    Health Maintenance  Topic Date Due   COVID-19 Vaccine (1 - 2023-24 season) Never done   INFLUENZA VACCINE  10/29/2023 (Originally 03/01/2023)   Cervical Cancer Screening (HPV/Pap Cotest)  11/27/2027   DTaP/Tdap/Td (2 - Td or Tdap) 06/10/2028   Hepatitis C Screening  Completed   HIV Screening  Completed   HPV VACCINES  Aged Out    Discussed health benefits of physical activity, and encouraged her to engage in regular exercise appropriate for her age and condition.  Problem List Items Addressed This Visit       Cardiovascular and Mediastinum   Perimenopausal vasomotor symptoms    Acute on chronic, reports irregular periods with use of nuva  ring Reports temperature fluctuations during day and night as well as sleep concerns Also notes mood concerns related to fatigue Recommend hormonal labs and plan to f/u with GYN provider to further assist       Relevant Orders   FSH/LH   Estradiol   Estrogens, total   Testosterone,Free and Total   Cortisol     Other   Annual physical exam - Primary    Things to do to keep yourself healthy  - Exercise at least 30-45 minutes a day, 3-4 days a week.  - Eat a low-fat diet with lots of fruits and vegetables, up to 7-9 servings per day.  - Seatbelts can save your life. Wear them always.  - Smoke detectors on every level of your home, check batteries every year.  - Eye Doctor - have an eye exam every 1-2 years  - Safe sex - if you may be exposed to STDs, use a condom.  - Alcohol -  If you drink, do it moderately, less than 2 drinks per day.  - Health Care Power of Attorney. Choose someone to speak for you if you are not able.  - Depression is common in our stressful world.If you're feeling down or losing interest in things you normally enjoy, please come in for a visit.  - Violence - If anyone is threatening or hurting you, please call immediately.       Relevant Orders   CBC with Differential/Platelet   Comprehensive metabolic panel   Lipid Panel With LDL/HDL Ratio   TSH   Avitaminosis D    Chronic, repeat labs Mood disorder noted thought to be 2/2 perimenopausal vasomotor symptoms Declines medication start like paxil to assist given c/f weight gain       Relevant Orders   VITAMIN D 25 Hydroxy (Vit-D Deficiency, Fractures)   Class 2 severe obesity due to excess calories with serious comorbidity and body mass index (BMI) of 36.0 to 36.9 in adult [facility])    Chronic, weight gain noted Body mass index is 36.83 kg/m. Continue to recommend balanced, lower carb meals. Smaller meal size, adding snacks. Choosing water as drink of choice and increasing purposeful exercise. Recommend  A1c      Relevant Orders   Hemoglobin A1c   Elevated LDL cholesterol level    Chronic, repeat LP recommend diet low in saturated fat and regular exercise - 30 min at least 5 times per week Not on medication at this time       Relevant Orders   Lipid Panel With LDL/HDL Ratio   Return in about 1 year (around 04/29/2024), or  if symptoms worsen or fail to improve, for annual examination.    Leilani Merl, FNP, have reviewed all documentation for this visit. The documentation on 04/30/23 for the exam, diagnosis, procedures, and orders are all accurate and complete.  Jacky Kindle, FNP  [facility] [phone] (phone) [phone] (fax)  [facility]

## 2023-05-01 ENCOUNTER — Telehealth: Payer: Self-pay | Admitting: Obstetrics and Gynecology

## 2023-05-01 NOTE — Telephone Encounter (Signed)
-----   Message from Jacky Kindle sent at 05/01/2023  8:23 AM EDT ----- Helmut Muster,  Pt with complaints that nuvaring is no longer helping with cycle control and having irregular bleeding; Hormonal levels are low; recommend consult with GYN to further assist given change in symptoms. I checked hormonal labs and they appear low; advised she should contact you for next steps.  Thanks, Jacky Kindle, FNP  [facility] [address] [place], [place] [address] [phone] (phone) [phone] (fax) [facility]

## 2023-05-01 NOTE — Telephone Encounter (Signed)
Spoke with pt. Had BTB first month of nuvaring, then 7 days periods on ring-free wk, now with BTB again. Pt unsure if she is on month 3 or 4 of nuvaring. Had neg GYN u/s. Hormone labs with PCP invalid since pt on nuvaring. Pt to try nuvaring 1 more month to see what bleeding does. If AUB persists, will try different BC.

## 2023-05-01 NOTE — Progress Notes (Signed)
Thank you for letting me know. I have called the pt. Since she was on hormones when labs done, hormonal labs aren't accurate. Her Gyn u/s was normal 5/24. Will give the nuvaring another month to see if AUB resolves since was having improvement.

## 2023-05-04 LAB — COMPREHENSIVE METABOLIC PANEL
ALT: 6 [IU]/L (ref 0–32)
AST: 11 [IU]/L (ref 0–40)
Albumin: 3.9 g/dL (ref 3.9–4.9)
Alkaline Phosphatase: 30 [IU]/L — ABNORMAL LOW (ref 44–121)
BUN/Creatinine Ratio: 15 (ref 9–23)
BUN: 11 mg/dL (ref 6–20)
Bilirubin Total: <0.2 mg/dL (ref 0.0–1.2)
CO2: 22 mmol/L (ref 20–29)
Calcium: 9.5 mg/dL (ref 8.7–10.2)
Chloride: 105 mmol/L (ref 96–106)
Creatinine, Ser: 0.71 mg/dL (ref 0.57–1.00)
Globulin, Total: 3.2 g/dL (ref 1.5–4.5)
Glucose: 83 mg/dL (ref 70–99)
Potassium: 4.9 mmol/L (ref 3.5–5.2)
Sodium: 141 mmol/L (ref 134–144)
Total Protein: 7.1 g/dL (ref 6.0–8.5)
eGFR: 115 mL/min/{1.73_m2} (ref >59)

## 2023-05-04 LAB — CBC WITH DIFFERENTIAL/PLATELET
Basophils Absolute: 0 10*3/uL (ref 0.0–0.2)
Basos: 0 %
EOS (ABSOLUTE): 0.2 10*3/uL (ref 0.0–0.4)
Eos: 2 %
Hematocrit: 39.1 % (ref 34.0–46.6)
Hemoglobin: 12.9 g/dL (ref 11.1–15.9)
Immature Grans (Abs): 0 10*3/uL (ref 0.0–0.1)
Immature Granulocytes: 0 %
Lymphocytes Absolute: 2.1 10*3/uL (ref 0.7–3.1)
Lymphs: 22 %
MCH: 29.4 pg (ref 26.6–33.0)
MCHC: 33 g/dL (ref 31.5–35.7)
MCV: 89 fL (ref 79–97)
Monocytes Absolute: 0.5 10*3/uL (ref 0.1–0.9)
Monocytes: 5 %
Neutrophils Absolute: 6.8 10*3/uL (ref 1.4–7.0)
Neutrophils: 71 %
Platelets: 284 10*3/uL (ref 150–450)
RBC: 4.39 x10E6/uL (ref 3.77–5.28)
RDW: 11.6 % — ABNORMAL LOW (ref 11.7–15.4)
WBC: 9.7 10*3/uL (ref 3.4–10.8)

## 2023-05-04 LAB — TSH: TSH: 1.32 u[IU]/mL (ref 0.450–4.500)

## 2023-05-04 LAB — HEMOGLOBIN A1C
Est. average glucose Bld gHb Est-mCnc: 100 mg/dL
Hgb A1c MFr Bld: 5.1 % (ref 4.8–5.6)

## 2023-05-04 LAB — ESTROGENS, TOTAL: Estrogen: 70 pg/mL

## 2023-05-04 LAB — TESTOSTERONE,FREE AND TOTAL
Testosterone, Free: <0.2 pg/mL (ref 0.0–4.2)
Testosterone: <3 ng/dL — ABNORMAL LOW (ref 8–60)

## 2023-05-04 LAB — LIPID PANEL WITH LDL/HDL RATIO
Cholesterol, Total: 201 mg/dL — ABNORMAL HIGH (ref 100–199)
HDL: 76 mg/dL (ref >39)
LDL Chol Calc (NIH): 108 mg/dL — ABNORMAL HIGH (ref 0–99)
LDL/HDL Ratio: 1.4 {ratio} (ref 0.0–3.2)
Triglycerides: 98 mg/dL (ref 0–149)
VLDL Cholesterol Cal: 17 mg/dL (ref 5–40)

## 2023-05-04 LAB — FSH/LH
FSH: 0.6 m[IU]/mL
LH: 0.4 m[IU]/mL

## 2023-05-04 LAB — CORTISOL: Cortisol: 14.1 ug/dL (ref 6.2–19.4)

## 2023-05-04 LAB — VITAMIN D 25 HYDROXY (VIT D DEFICIENCY, FRACTURES): Vit D, 25-Hydroxy: 10.2 ng/mL — ABNORMAL LOW (ref 30.0–100.0)

## 2023-05-04 LAB — ESTRADIOL: Estradiol: <5 pg/mL

## 2023-07-15 ENCOUNTER — Telehealth: Payer: Managed Care, Other (non HMO) | Admitting: Family

## 2023-07-15 DIAGNOSIS — R399 Unspecified symptoms and signs involving the genitourinary system: Secondary | ICD-10-CM | POA: Diagnosis not present

## 2023-07-15 MED ORDER — CEPHALEXIN 500 MG PO CAPS: 500.0000 mg | ORAL_CAPSULE | Freq: Two times a day (BID) | ORAL | 0 refills | Status: DC

## 2023-07-15 NOTE — Progress Notes (Signed)
E-Visit for Urinary Problems  We are sorry that you are not feeling well.  Here is how we plan to help!  Based on what you shared with me it looks like you most likely have a simple urinary tract infection.  A UTI (Urinary Tract Infection) is a bacterial infection of the bladder.  Most cases of urinary tract infections are simple to treat but a key part of your care is to encourage you to drink plenty of fluids and watch your symptoms carefully.  I have prescribed Keflex 500 mg twice a day for 7 days.  Your symptoms should gradually improve. Call us if the burning in your urine worsens, you develop worsening fever, back pain or pelvic pain or if your symptoms do not resolve after completing the antibiotic.  Urinary tract infections can be prevented by drinking plenty of water to keep your body hydrated.  Also be sure when you wipe, wipe from front to back and don't hold it in!  If possible, empty your bladder every 4 hours.  HOME CARE Drink plenty of fluids Compete the full course of the antibiotics even if the symptoms resolve Remember, when you need to go.go. Holding in your urine can increase the likelihood of getting a UTI! GET HELP RIGHT AWAY IF: You cannot urinate You get a high fever Worsening back pain occurs You see blood in your urine You feel sick to your stomach or throw up You feel like you are going to pass out  MAKE SURE YOU  Understand these instructions. Will watch your condition. Will get help right away if you are not doing well or get worse.   Thank you for choosing an e-visit.  Your e-visit answers were reviewed by a board certified advanced clinical practitioner to complete your personal care plan. Depending upon the condition, your plan could have included both over the counter or prescription medications.  Please review your pharmacy choice. Make sure the pharmacy is open so you can pick up prescription now. If there is a problem, you may contact your  provider through MyChart messaging and have the prescription routed to another pharmacy.  Your safety is important to us. If you have drug allergies check your prescription carefully.   For the next 24 hours you can use MyChart to ask questions about today's visit, request a non-urgent call back, or ask for a work or school excuse. You will get an email in the next two days asking about your experience. I hope that your e-visit has been valuable and will speed your recovery.  Approximately 5 minutes was spent documenting and reviewing patient's chart.    

## 2023-11-13 ENCOUNTER — Other Ambulatory Visit: Payer: Self-pay | Admitting: Obstetrics and Gynecology

## 2023-11-13 DIAGNOSIS — N939 Abnormal uterine and vaginal bleeding, unspecified: Secondary | ICD-10-CM

## 2023-12-17 ENCOUNTER — Other Ambulatory Visit: Payer: Self-pay

## 2023-12-17 DIAGNOSIS — N939 Abnormal uterine and vaginal bleeding, unspecified: Secondary | ICD-10-CM

## 2023-12-17 MED ORDER — ETONOGESTREL-ETHINYL ESTRADIOL 0.12-0.015 MG/24HR VA RING: VAGINAL_RING | VAGINAL | 1 refills | Status: DC

## 2023-12-18 ENCOUNTER — Ambulatory Visit: Payer: Self-pay | Admitting: *Deleted

## 2023-12-18 NOTE — Telephone Encounter (Signed)
 Copied from CRM [id number]. Topic: Clinical - Red Word Triage >> Dec 18, 2023  3:18 PM Donald Frost wrote: Red Word that prompted transfer to Nurse Triage: The patient called in stating since Friday she has been been dizzy, short of breath and been dealing with migraines. I will transfer her to E2C2 NT Reason for Disposition  [1] MODERATE headache (e.g., interferes with normal activities) AND [2] present > 24 hours AND [3] unexplained  (Exceptions: analgesics not tried, typical migraine, or headache part of viral illness)  Answer Assessment - Initial Assessment Questions 1. LOCATION: "Where does it hurt?"      I'm having migraine that started Friday.   Sat. Morning woke up I still had the migraine.   Today I got dizzy and had to sit down at work.   I want to be seen.    2. ONSET: "When did the headache start?" (Minutes, hours or days)      Friday.   I'm taking Excedrin Migraine and Tylenol  and it has not let up.    I'm sensitive to light.  The dizziness is really bothering the most outside the headache. 3. PATTERN: "Does the pain come and go, or has it been constant since it started?"     Constant 4. SEVERITY: "How bad is the pain?" and "What does it keep you from doing?"  (e.g., Scale 1-10; mild, moderate, or severe)   - MILD (1-3): doesn't interfere with normal activities    - MODERATE (4-7): interferes with normal activities or awakens from sleep    - SEVERE (8-10): excruciating pain, unable to do any normal activities        Severe 5. RECURRENT SYMPTOM: "Have you ever had headaches before?" If Yes, ask: "When was the last time?" and "What happened that time?"      Yes 6. CAUSE: "What do you think is causing the headache?"     Migraine 7. MIGRAINE: "Have you been diagnosed with migraine headaches?" If Yes, ask: "Is this headache similar?"      Yes 8. HEAD INJURY: "Has there been any recent injury to the head?"      Not asked  9. OTHER SYMPTOMS: "Do you have any other symptoms?" (fever, stiff  neck, eye pain, sore throat, cold symptoms)     No 10. PREGNANCY: "Is there any chance you are pregnant?" "When was your last menstrual period?"       No  Protocols used: Headache-A-AH  Chief Complaint: Migraine headache with dizziness since Friday. Symptoms: sensitive to light, not feeling like herself.  Excedrin Migraine and Tylenol  not helping the migraine. Frequency: Since Friday Pertinent Negatives: Patient denies OTC medications helping Disposition: [] ED /[] Urgent Care (no appt availability in office) / [x] Appointment(In office/virtual)/ []  [facility]/ [] Home Care/ [] Refused Recommended Disposition /[] [facility]/ []  Follow-up with PCP Additional Notes: Appt made with Dr. Athena Bland for 12/19/2023 at 1:40.

## 2023-12-19 ENCOUNTER — Ambulatory Visit (INDEPENDENT_AMBULATORY_CARE_PROVIDER_SITE_OTHER): Admitting: Family Medicine

## 2023-12-19 VITALS — BP 113/85 | HR 84 | Ht 60.0 in | Wt 196.1 lb

## 2023-12-19 DIAGNOSIS — G43009 Migraine without aura, not intractable, without status migrainosus: Secondary | ICD-10-CM

## 2023-12-19 DIAGNOSIS — J301 Allergic rhinitis due to pollen: Secondary | ICD-10-CM | POA: Diagnosis not present

## 2023-12-19 MED ORDER — CETIRIZINE HCL 10 MG PO TABS: 10.0000 mg | ORAL_TABLET | Freq: Every day | ORAL | 3 refills | Status: AC

## 2023-12-19 MED ORDER — BUTALBITAL-APAP-CAFFEINE 50-325-40 MG PO TABS: 1.0000 | ORAL_TABLET | Freq: Four times a day (QID) | ORAL | 0 refills | Status: AC | PRN

## 2023-12-19 MED ORDER — FLUTICASONE PROPIONATE 50 MCG/ACT NA SUSP: 2.0000 | Freq: Every day | NASAL | 6 refills | Status: AC

## 2023-12-19 NOTE — Progress Notes (Signed)
 Established patient visit   Patient: Deanna Vaughn   DOB: March 16, 1991   33 y.o. Female  MRN: [id number] Visit Date: 12/19/2023  Today's healthcare provider: Carlean Charter, DO   Chief Complaint  Patient presents with   Migraine    Migraine with dizziness started Friday. Has tried Excerdin, tylenol , acetaminophen ?    Subjective    Migraine    Deanna Vaughn is a 33 year old female who presents with migraine and dizziness.  She experiences migraines throughout the year, with an increase in frequency to two to three times a week. The headache is located on the right side of her head (right parietal region) and is often accompanied by dizziness, although currently, she is experiencing only the headache. The migraines typically last about two days and are associated with photophobia. No nausea, back pain, or neck pain is reported. She denies any aura or prodromal symptoms before the onset of migraines.  She has tried various over-the-counter medications such as Excedrin, Tylenol , and acetaminophen , but none have provided relief. She recalls being prescribed a medication by a doctor one to two years ago, but cannot remember the name. She has been treated with Toradol , diclofenac , and fluticasone  in the past, and has used Excedrin for headaches.  Her allergies have been more prominent this year. She has used Flonase  in the past but has not been using it recently. She has previously taken Claritin for allergies, which she found to be somewhat effective without causing significant drowsiness. No sneezing is reported except for this year.      Medications: Outpatient Medications Prior to Visit  Medication Sig   etonogestrel -ethinyl estradiol  (NUVARING) 0.12-0.015 MG/24HR vaginal ring Insert vaginally and leave in place for 3 consecutive weeks, then remove for 1 week.   cephALEXin  (KEFLEX ) 500 MG capsule Take 1 capsule (500 mg total) by mouth 2 (two) times daily.   No  facility-administered medications prior to visit.        Objective    BP 113/85 (BP Location: Left Arm, Patient Position: Sitting, Cuff Size: Normal)   Pulse 84   Ht 5' (1.524 m)   Wt 196 lb 1.6 oz (89 kg)   SpO2 100%   BMI 38.30 kg/m     Physical Exam Vitals and nursing note reviewed.  Constitutional:      General: She is not in acute distress.    Appearance: Normal appearance.  HENT:     Head: Normocephalic and atraumatic.     Right Ear: Tympanic membrane, ear canal and external ear normal. There is no impacted cerumen.     Left Ear: Tympanic membrane, ear canal and external ear normal. There is no impacted cerumen.     Nose: Congestion and rhinorrhea present.     Right Turbinates: Enlarged and swollen.     Left Turbinates: Enlarged and swollen.     Mouth/Throat:     Mouth: Mucous membranes are moist.     Pharynx: Oropharynx is clear. No oropharyngeal exudate or posterior oropharyngeal erythema.  Eyes:     General: No scleral icterus.    Extraocular Movements:     Right eye: Normal extraocular motion.     Left eye: Normal extraocular motion.     Conjunctiva/sclera: Conjunctivae normal.  Cardiovascular:     Rate and Rhythm: Normal rate.  Pulmonary:     Effort: Pulmonary effort is normal.  Neurological:     Mental Status: She is alert and oriented to person, place,  and time. Mental status is at baseline.  Psychiatric:        Mood and Affect: Mood normal.        Behavior: Behavior normal.      No results found for any visits on 12/19/23.  Assessment & Plan    Migraine without aura and without status migrainosus, not intractable -     Butalbital -APAP-Caffeine ; Take 1 tablet by mouth every 6 (six) hours as needed for headache.  Dispense: 14 tablet; Refill: 0 -     Fluticasone  Propionate; Place 2 sprays into both nostrils daily.  Dispense: 16 g; Refill: 6  Seasonal allergic rhinitis due to pollen -     Cetirizine  HCl; Take 1 tablet (10 mg total) by mouth at  bedtime.  Dispense: 90 tablet; Refill: 3    Migraine with dizziness Chronic migraines, right-sided, with light sensitivity. Previous treatments ineffective. Fioricet selected for symptom management. - Prescribe Fioricet for migraine management.  Seasonal allergic rhinitis Swollen turbinates indicate allergic rhinitis. Previous treatments moderately effective. Discussed Zyrtec  as a stronger alternative. Emphasized consistent use to reduce symptoms. - Start Zyrtec  at night for allergy management. - Use Flonase  for nasal symptoms.    Return in about 19 weeks (around 04/30/2024), or if symptoms worsen or fail to improve, for CPE.      I discussed the assessment and treatment plan with the patient  The patient was provided an opportunity to ask questions and all were answered. The patient agreed with the plan and demonstrated an understanding of the instructions.   The patient was advised to call back or seek an in-person evaluation if the symptoms worsen or if the condition fails to improve as anticipated.    Carlean Charter, DO  [facility] [phone] (phone) [phone] (fax)  [facility]

## 2024-01-03 NOTE — Progress Notes (Signed)
 PCP:  Carlean Charter, DO   Chief Complaint  Patient presents with   Gynecologic Exam     HPI:      [name] is a 33 y.o. [phone] whose LMP was Patient's last menstrual period was 12/07/2023 (approximate)., presents today for her annual examination.  Her menses are infrequent now with restarting nuvaring, lasting 1-2 days, light flow, no BTB, no dysmen. Takes nuvaring ring out Q3 wks but doesn't always bleed. Was having AUB last yr with neg GYN u/s 5/24; s/p endometrial ablation 2/23 for AUB with Dr. Raquel Cables. Did nuvaring and Mirena IUD in past for [facility] without side effects. No hx of HTN, DVTs, migraines with aura.   Sex activity: single partner, contraception - tubal ligation. Has pelvic pain/dryness with sex, even with using lubricants. Tampons can be painful as well. Sx since birth of 3rd child.  Last Pap: 11/27/22  Results were: no abnormalities /neg HPV DNA. No hx of abn paps.   There is a FH of breast cancer in her MGM, genetic testing not indicated for pt. There is no FH of ovarian cancer. The patient does do self-breast exams.  Tobacco use: The patient denies current or previous tobacco use. Alcohol use: none No drug use.  Exercise: min active  She does get adequate calcium  but not Vitamin D  in her diet.  Patient Active Problem List   Diagnosis Date Noted   Annual physical exam 04/30/2023   Class 2 severe obesity due to excess calories with serious comorbidity and body mass index (BMI) of 36.0 to 36.9 in adult (HCC) 04/30/2023   Avitaminosis D 04/30/2023   Elevated LDL cholesterol level 04/30/2023   Perimenopausal vasomotor symptoms 04/30/2023    Past Surgical History:  Procedure Laterality Date   CESAREAN SECTION  08/01/2007   CESAREAN SECTION WITH BILATERAL TUBAL LIGATION N/A 11/18/2015   Procedure: CESAREAN SECTION ;  Surgeon: Alben Alma, MD;  Location: [facility] ORS;  Service: Obstetrics;  Laterality: N/A;   CESAREAN SECTION WITH BILATERAL TUBAL LIGATION  N/A 08/08/2018   Procedure: CESAREAN SECTION WITH BILATERAL TUBAL LIGATION;  Surgeon: Alben Alma, MD;  Location: [facility] ORS;  Service: Obstetrics;  Laterality: N/A;   CHOLECYSTECTOMY  2022   HYSTEROSCOPY WITH D & C N/A 09/08/2021   Procedure: DILATATION AND CURETTAGE /HYSTEROSCOPY with ENDOMETRIAL ABLATION;  Surgeon: Alben Alma, MD;  Location: [facility] ORS;  Service: Gynecology;  Laterality: N/A;    Family History  Problem Relation Age of Onset   Hypertension Mother    Diabetes Mother    Breast cancer Maternal Grandmother        42s   Cancer Maternal Aunt     Social History   Socioeconomic History   Marital status: Significant Other    Spouse name: Not on file   Number of children: 3   Years of education: 12+   Highest education level: Some college, no degree  Occupational History   Not on file  Tobacco Use   Smoking status: Never   Smokeless tobacco: Never  Vaping Use   Vaping status: Never Used  Substance and Sexual Activity   Alcohol use: Not Currently   Drug use: No   Sexual activity: Yes    Partners: Male    Birth control/protection: Inserts, Surgical    Comment: Tubal Ligation  Other Topics Concern   Not on file  Social History Narrative   Not on file   Social Drivers of Corporate investment banker  Strain: Low Risk  (12/19/2023)   Overall Financial Resource Strain (CARDIA)    Difficulty of Paying Living Expenses: Not hard at all  Food Insecurity: No Food Insecurity (12/19/2023)   Hunger Vital Sign    Worried About Running Out of Food in the Last Year: Never true    Ran Out of Food in the Last Year: Never true  Transportation Needs: No Transportation Needs (12/19/2023)   PRAPARE - Administrator, Civil Service (Medical): No    Lack of Transportation (Non-Medical): No  Physical Activity: Unknown (12/19/2023)   Exercise Vital Sign    Days of Exercise per Week: 0 days    Minutes of Exercise per Session: Not on file  Stress: No Stress Concern  Present (12/19/2023)   [employer] of Occupational Health - Occupational Stress Questionnaire    Feeling of Stress : Not at all  Social Connections: Moderately Isolated (12/19/2023)   Social Connection and Isolation Panel [NHANES]    Frequency of Communication with Friends and Family: More than three times a week    Frequency of Social Gatherings with Friends and Family: Once a week    Attends Religious Services: Never    Database administrator or Organizations: No    Attends Engineer, structural: Not on file    Marital Status: Living with partner  Intimate Partner Violence: Not on file     Current Outpatient Medications:    butalbital -acetaminophen -caffeine  (FIORICET) 50-325-40 MG tablet, Take 1 tablet by mouth every 6 (six) hours as needed for headache., Disp: 14 tablet, Rfl: 0   cetirizine  (ZYRTEC ) 10 MG tablet, Take 1 tablet (10 mg total) by mouth at bedtime., Disp: 90 tablet, Rfl: 3   fluticasone  (FLONASE ) 50 MCG/ACT nasal spray, Place 2 sprays into both nostrils daily., Disp: 16 g, Rfl: 6   etonogestrel -ethinyl estradiol  (NUVARING) 0.12-0.015 MG/24HR vaginal ring, Insert vaginally and leave in place for 3 consecutive weeks, then remove for 1 week., Disp: 3 each, Rfl: 3     ROS:  Review of Systems  Constitutional:  Positive for fatigue. Negative for fever and unexpected weight change.  Respiratory:  Negative for cough, shortness of breath and wheezing.   Cardiovascular:  Negative for chest pain, palpitations and leg swelling.  Gastrointestinal:  Negative for blood in stool, constipation, diarrhea, nausea and vomiting.  Endocrine: Negative for cold intolerance, heat intolerance and polyuria.  Genitourinary:  Positive for dyspareunia. Negative for dysuria, flank pain, frequency, genital sores, hematuria, menstrual problem, pelvic pain, urgency, vaginal bleeding, vaginal discharge and vaginal pain.  Musculoskeletal:  Negative for back pain, joint swelling and myalgias.   Skin:  Negative for rash.  Neurological:  Negative for dizziness, syncope, light-headedness, numbness and headaches.  Hematological:  Negative for adenopathy.  Psychiatric/Behavioral:  Negative for agitation, confusion, sleep disturbance and suicidal ideas. The patient is not nervous/anxious.    BREAST: No symptoms   Objective: BP 116/82   Pulse 71   Ht 5' (1.524 m)   Wt 199 lb 1.6 oz (90.3 kg)   LMP 12/07/2023 (Approximate)   BMI 38.88 kg/m    Physical Exam Constitutional:      Appearance: She is well-developed.  Genitourinary:     Vulva normal.     Genitourinary Comments: SUPRAPUBIC AREA TENDER TO PALPATE WITH BIMANUAL EXAM     Right Labia: No rash, tenderness or lesions.    Left Labia: No tenderness, lesions or rash.    No vaginal discharge, erythema or tenderness.  No vaginal prolapse present.    No vaginal atrophy present.     Right Adnexa: not tender and no mass present.    Left Adnexa: not tender and no mass present.    No cervical friability or polyp.     Uterus is tender.     Uterus is not enlarged.  Breasts:    Right: No mass, nipple discharge, skin change or tenderness.     Left: No mass, nipple discharge, skin change or tenderness.  Neck:     Thyroid: No thyromegaly.  Cardiovascular:     Rate and Rhythm: Normal rate and regular rhythm.     Heart sounds: Normal heart sounds. No murmur heard. Pulmonary:     Effort: Pulmonary effort is normal.     Breath sounds: Normal breath sounds.  Abdominal:     Palpations: Abdomen is soft.     Tenderness: There is no abdominal tenderness. There is no guarding or rebound.  Musculoskeletal:        General: Normal range of motion.     Cervical back: Normal range of motion.  Lymphadenopathy:     Cervical: No cervical adenopathy.  Neurological:     General: No focal deficit present.     Mental Status: She is alert and oriented to person, place, and time.     Cranial Nerves: No cranial nerve deficit.  Skin:     General: Skin is warm and dry.  Psychiatric:        Mood and Affect: Mood normal.        Behavior: Behavior normal.        Thought Content: Thought content normal.        Judgment: Judgment normal.  Vitals reviewed.     Assessment/Plan: Encounter for annual routine gynecological examination  Encounter for surveillance of vaginal ring hormonal contraceptive device - Plan: etonogestrel -ethinyl estradiol  (NUVARING) 0.12-0.015 MG/24HR vaginal ring; Rx RF eRxd, doing well  Abnormal uterine bleeding (AUB) - Plan: etonogestrel -ethinyl estradiol  (NUVARING) 0.12-0.015 MG/24HR vaginal ring; sx resolved with nuvaring  Dyspareunia in female--suprapubic area tender to palpate on exam; neg Gyn u/s 5/24; sx since birth of last child  Meds ordered this encounter  Medications   etonogestrel -ethinyl estradiol  (NUVARING) 0.12-0.015 MG/24HR vaginal ring    Sig: Insert vaginally and leave in place for 3 consecutive weeks, then remove for 1 week.    Dispense:  3 each    Refill:  3    Supervising Provider:   ROBY, MICIA [[id number]]          GYN counsel adequate intake of calcium  and vitamin D , diet and exercise     F/U  Return in about 1 year (around 01/06/2025).  Raekwan Spelman B. Breleigh Carpino, PA-C 01/07/2024 2:18 PM

## 2024-01-07 ENCOUNTER — Ambulatory Visit (INDEPENDENT_AMBULATORY_CARE_PROVIDER_SITE_OTHER): Admitting: Obstetrics and Gynecology

## 2024-01-07 ENCOUNTER — Encounter: Payer: Self-pay | Admitting: Obstetrics and Gynecology

## 2024-01-07 VITALS — BP 116/82 | HR 71 | Ht 60.0 in | Wt 199.1 lb

## 2024-01-07 DIAGNOSIS — Z3044 Encounter for surveillance of vaginal ring hormonal contraceptive device: Secondary | ICD-10-CM

## 2024-01-07 DIAGNOSIS — Z01419 Encounter for gynecological examination (general) (routine) without abnormal findings: Secondary | ICD-10-CM

## 2024-01-07 DIAGNOSIS — N939 Abnormal uterine and vaginal bleeding, unspecified: Secondary | ICD-10-CM

## 2024-01-07 DIAGNOSIS — N941 Unspecified dyspareunia: Secondary | ICD-10-CM

## 2024-01-07 MED ORDER — ETONOGESTREL-ETHINYL ESTRADIOL 0.12-0.015 MG/24HR VA RING: VAGINAL_RING | VAGINAL | 3 refills | Status: AC

## 2024-01-07 NOTE — Patient Instructions (Signed)
 I value your feedback and you entrusting [place] with your care. If you get a [place] patient survey, I would appreciate you taking the time to let us know about your experience today. Thank you! ? ? ?

## 2024-05-05 ENCOUNTER — Encounter: Admitting: Family Medicine

## 2024-05-12 ENCOUNTER — Ambulatory Visit (INDEPENDENT_AMBULATORY_CARE_PROVIDER_SITE_OTHER): Admitting: Family Medicine

## 2024-05-12 ENCOUNTER — Encounter: Payer: Managed Care, Other (non HMO) | Admitting: Family Medicine

## 2024-05-12 ENCOUNTER — Encounter: Payer: Self-pay | Admitting: Family Medicine

## 2024-05-12 VITALS — BP 119/77 | HR 79 | Temp 98.8°F | Ht 60.0 in | Wt 203.7 lb

## 2024-05-12 DIAGNOSIS — M545 Low back pain, unspecified: Secondary | ICD-10-CM

## 2024-05-12 DIAGNOSIS — E559 Vitamin D deficiency, unspecified: Secondary | ICD-10-CM | POA: Diagnosis not present

## 2024-05-12 DIAGNOSIS — E7800 Pure hypercholesterolemia, unspecified: Secondary | ICD-10-CM | POA: Diagnosis not present

## 2024-05-12 DIAGNOSIS — Z0001 Encounter for general adult medical examination with abnormal findings: Secondary | ICD-10-CM | POA: Diagnosis not present

## 2024-05-12 DIAGNOSIS — R202 Paresthesia of skin: Secondary | ICD-10-CM

## 2024-05-12 DIAGNOSIS — G8929 Other chronic pain: Secondary | ICD-10-CM

## 2024-05-12 DIAGNOSIS — Z0000 Encounter for general adult medical examination without abnormal findings: Secondary | ICD-10-CM

## 2024-05-12 NOTE — Progress Notes (Signed)
 Complete physical exam   Patient: Deanna Vaughn   DOB: 1991/05/21   33 y.o. Female  MRN: [id number] Visit Date: 05/12/2024  Today's healthcare provider: LAURAINE LOISE BUOY, DO   Chief Complaint  Patient presents with   Annual Exam    Diet- General Exercise- No Overall Feeling- Ok Sleep- Pretty good Concerns- Not at the moment  Declined all vaccines   Subjective    Deanna Vaughn is a 33 y.o. female who presents today for a complete physical exam.   HPI HPI     Annual Exam    Additional comments: Diet- General Exercise- No Overall Feeling- Ok Sleep- Pretty good Concerns- Not at the moment  Declined all vaccines      Last edited by Vaughn, Deanna  L, CMA on 05/12/2024  2:45 PM.      Deanna Vaughn is a 33 year old female who presents for an annual physical exam.  She has opted not to receive any vaccines today, including the flu shot and HPV vaccine. She recalls receiving two doses of the Gardasil vaccine in middle school, which is confirmed in the state registry. She does not have specific records available for the hepatitis B vaccine series, but the state registry shows that she received it during her school years (2002-2003, x3 doses).  She experiences what she refers to as 'normal' back pain, which she attributes to an epidural received in the past. 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   She has a history of low vitamin D  levels and is currently taking 4000 IU of vitamin D  daily, along with a multivitamin. She reports occasional numbness/tingling, which resolves on its own.  She is sexually active without experiencing pain or discharge.  She does not consume alcohol and has no significant issues with depression or anxiety.      Past Medical History:  Diagnosis Date   Anemia    Past Surgical History:  Procedure Laterality Date   CESAREAN SECTION  08/01/2007   CESAREAN SECTION WITH  BILATERAL TUBAL LIGATION N/A 11/18/2015   Procedure: CESAREAN SECTION ;  Surgeon: Deanna Vaughn Lesches, MD;  Location: [facility];  Service: Obstetrics;  Laterality: N/A;   CESAREAN SECTION WITH BILATERAL TUBAL LIGATION N/A 08/08/2018   Procedure: CESAREAN SECTION WITH BILATERAL TUBAL LIGATION;  Surgeon: Vaughn Deanna SHAUNNA, MD;  Location: [facility];  Service: Obstetrics;  Laterality: N/A;   CHOLECYSTECTOMY  2022   HYSTEROSCOPY WITH D & C N/A 09/08/2021   Procedure: DILATATION AND CURETTAGE /HYSTEROSCOPY with ENDOMETRIAL ABLATION;  Surgeon: Vaughn Deanna SHAUNNA, MD;  Location: [facility] OR[facility];  Service: Gynecology;  Laterality: N/A;   Social History   Socioeconomic History   Marital status: Significant Other    Spouse name: Not on file   Number of children: 3   Years of education: 12+   Highest education level: GED or equivalent  Occupational History   Not on file  Tobacco Use   Smoking status: Never   Smokeless tobacco: Never  Vaping Use   Vaping status: Never Used  Substance and Sexual Activity   Alcohol use: Never   Drug use: No   Sexual activity: Yes    Partners: Male    Birth control/protection: Inserts, Surgical    Comment: Tubal Ligation  Other Topics Concern   Not on file  Social History Narrative   Not on file   Social Drivers of Health   Financial  Resource Strain: Low Risk  (05/01/2024)   Overall Financial Resource Strain (CARDIA)    Difficulty of Paying Living Expenses: Not hard at all  Food Insecurity: No Food Insecurity (05/01/2024)   Hunger Vital Sign    Worried About Running Out of Food in the Last Year: Never true    Ran Out of Food in the Last Year: Never true  Transportation Needs: No Transportation Needs (05/01/2024)   PRAPARE - Administrator, Civil Service (Medical): No    Lack of Transportation (Non-Medical): No  Physical Activity: Inactive (05/01/2024)   Exercise Vital Sign    Days of Exercise per Week: 0 days    Minutes of Exercise per Session: Not on file   Stress: No Stress Concern Present (05/01/2024)   [employer] of Occupational Health - Occupational Stress Questionnaire    Feeling of Stress: Not at all  Social Connections: Moderately Isolated (05/01/2024)   Social Connection and Isolation Panel    Frequency of Communication with Friends and Family: More than three times a week    Frequency of Social Gatherings with Friends and Family: Once a week    Attends Religious Services: Never    Database administrator or Organizations: No    Attends Engineer, structural: Not on file    Marital Status: Living with partner  Intimate Partner Violence: Not At Risk (05/12/2024)   Humiliation, Afraid, Rape, and Kick questionnaire    Fear of Current or Ex-Partner: No    Emotionally Abused: No    Physically Abused: No    Sexually Abused: No   Family Status  Relation Name Status   Mother Deanna Vaughn   Father  Vaughn   Sister  Vaughn   Brother  Vaughn   MGM  Deanna Vaughn   Daughter  Vaughn   Son  Vaughn   Son  Vaughn   Mat Aunt Deanna Vaughn (Not Specified)  No partnership data on file   Family History  Problem Relation Age of Onset   Hypertension Mother    Diabetes Mother    Breast cancer Maternal Grandmother        61s   Cancer Maternal Aunt    No Known Allergies  Patient Care Team: Deanna Vaughn, Deanna SAILOR, DO as PCP - General (Family Medicine)   Medications: Outpatient Medications Prior to Visit  Medication Sig   butalbital -acetaminophen -caffeine  (FIORICET) 50-325-40 MG tablet Take 1 tablet by mouth every 6 (six) hours as needed for headache.   cetirizine  (ZYRTEC ) 10 MG tablet Take 1 tablet (10 mg total) by mouth at bedtime. (Patient taking differently: Take 10 mg by mouth as needed for allergies.)   Cholecalciferol (VITAMIN D3) 50 MCG (2000 UT) CHEW Chew 4,000 Units by mouth daily.   etonogestrel -ethinyl estradiol  (NUVARING) 0.12-0.015 MG/24HR vaginal ring Insert vaginally and leave in place for 3 consecutive weeks, then  remove for 1 week.   fluticasone  (FLONASE ) 50 MCG/ACT nasal spray Place 2 sprays into both nostrils daily. (Patient taking differently: Place 2 sprays into both nostrils as needed for allergies.)   No facility-administered medications prior to visit.    Review of Systems  Constitutional:  Negative for chills, fatigue and fever.  HENT:  Negative for congestion, ear pain, rhinorrhea, sneezing and sore throat.   Eyes: Negative.  Negative for pain and redness.  Respiratory:  Negative for cough, shortness of breath and wheezing.   Cardiovascular:  Negative for chest pain and leg swelling.  Gastrointestinal:  Negative for abdominal pain,  blood in stool, constipation, diarrhea, nausea and vomiting.  Endocrine: Negative for polydipsia and polyphagia.  Genitourinary: Negative.  Negative for dyspareunia, dysuria, flank pain, hematuria, pelvic pain, vaginal bleeding and vaginal discharge.  Musculoskeletal:  Negative for arthralgias, back pain, gait problem and joint swelling.  Skin:  Negative for rash.  Neurological:  Positive for numbness (intermittent). Negative for dizziness, tremors, seizures, weakness, light-headedness and headaches.  Hematological:  Negative for adenopathy.  Psychiatric/Behavioral: Negative.  Negative for behavioral problems, confusion and dysphoric mood. The patient is not nervous/anxious and is not hyperactive.       Objective    BP 119/77 (BP Location: Left Arm, Patient Position: Sitting, Cuff Size: Normal)   Pulse 79   Temp 98.8 F (37.1 C) (Oral)   Ht 5' (1.524 m)   Wt 203 lb 11.2 oz (92.4 kg)   SpO2 100%   BMI 39.78 kg/m    Physical Exam Vitals and nursing note reviewed.  Constitutional:      General: She is awake.     Appearance: Normal appearance.  HENT:     Head: Normocephalic and atraumatic.     Right Ear: Tympanic membrane, ear canal and external ear normal.     Left Ear: Tympanic membrane, ear canal and external ear normal.     Nose: Nose normal.      Mouth/Throat:     Mouth: Mucous membranes are moist.     Pharynx: Oropharynx is clear. No oropharyngeal exudate or posterior oropharyngeal erythema.  Eyes:     General: No scleral icterus.    Extraocular Movements: Extraocular movements intact.     Conjunctiva/sclera: Conjunctivae normal.     Pupils: Pupils are equal, round, and reactive to light.  Neck:     Thyroid: No thyromegaly or thyroid tenderness.  Cardiovascular:     Rate and Rhythm: Normal rate and regular rhythm.     Pulses: Normal pulses.     Heart sounds: Normal heart sounds.  Pulmonary:     Effort: Pulmonary effort is normal. No tachypnea, bradypnea or respiratory distress.     Breath sounds: Normal breath sounds. No stridor. No wheezing, rhonchi or rales.  Abdominal:     General: Bowel sounds are normal. There is no distension.     Palpations: Abdomen is soft. There is no mass.     Tenderness: There is no abdominal tenderness. There is no guarding.     Hernia: No hernia is present.  Musculoskeletal:     Cervical back: Normal range of motion and neck supple.     Right lower leg: No edema.     Left lower leg: No edema.  Lymphadenopathy:     Cervical: No cervical adenopathy.  Skin:    General: Skin is warm and dry.  Neurological:     Mental Status: She is alert and oriented to person, place, and time. Mental status is at baseline.  Psychiatric:        Mood and Affect: Mood normal.        Behavior: Behavior normal.      Last depression screening scores    05/12/2024    2:37 PM 12/19/2023    1:53 PM 04/30/2023    9:10 AM  PHQ 2/9 Scores  PHQ - 2 Score 0 0 0  PHQ- 9 Score 5 8 8    Last fall risk screening    05/12/2024    2:37 PM  Fall Risk   Falls in the past year? 0  Number falls in past  yr: 0  Injury with Fall? 0  Risk for fall due to : No Fall Risks   Last Audit-C alcohol use screening    05/12/2024    2:38 PM  Alcohol Use Disorder Test (AUDIT)  1. How often do you have a drink containing  alcohol? 0  2. How many drinks containing alcohol do you have on a typical day when you are drinking? 0  3. How often do you have six or more drinks on one occasion? 0  AUDIT-C Score 0   A score of 3 or more in women, and 4 or more in men indicates increased risk for alcohol abuse, EXCEPT if all of the points are from question 1   No results found for any visits on 05/12/24.  Assessment & Plan    Routine Health Maintenance and Physical Exam  Exercise Activities and Dietary recommendations  Goals   None     Immunization History  Administered Date(s) Administered   HPV Quadrivalent 09/24/2006, 02/08/2007   Influenza-Unspecified 02/28/2017   Tdap 06/10/2018    Health Maintenance  Topic Date Due   Influenza Vaccine  10/28/2024 (Originally 02/29/2024)   COVID-19 Vaccine (3 - 2025-26 season) 03/31/2025 (Originally 03/31/2024)   Cervical Cancer Screening (HPV/Pap Cotest)  11/27/2027   DTaP/Tdap/Td (2 - Td or Tdap) 06/10/2028   Hepatitis C Screening  Completed   HIV Screening  Completed   Pneumococcal Vaccine  Aged Out   Meningococcal B Vaccine  Aged Out   Hepatitis B Vaccines 19-59 Average Risk  Discontinued   HPV VACCINES  Discontinued    Discussed health benefits of physical activity, and encouraged her to engage in regular exercise appropriate for her age and condition.   Annual physical exam -     Comprehensive metabolic panel with GFR  Vitamin D  deficiency -     VITAMIN D  25 Hydroxy (Vit-D Deficiency, Fractures)  Elevated LDL cholesterol level -     Lipid panel  Paresthesias -     Vitamin B12     Annual physical exam Routine wellness visit with no acute issues. Physical exam overall unremarkable except as noted above. Routine lab work ordered as noted.  Regular menstrual cycles, no significant alcohol use, no depression or anxiety symptoms. Engages in walking for exercise. - Encouraged regular physical activity, including walking, squats, and dips. - Discussed  stretching exercises to alleviate potential low back pain from prolonged sitting, which she has to do at work.  Chronic low back pain without sciatica Chronic low back pain attributed by patient to epidural, described as normal and manageable.  Patient does work at a job that requires prolonged sitting and engages in minimal exercise at this time. - Patient to continue to work on increasing exercise habits. - Encouraged stretching exercises to reduce low back pain.  Elevated LDL cholesterol level Goal LDL less than 100.  Recheck lipid panel today.  Discussed and encouraged lifestyle modifications.  Vitamin D  deficiency History of low vitamin D  levels, with most recent level of 10.2 on 04/30/2023.  Currently taking 4000 IU of vitamin D3 daily. - Order vitamin D  level to assess current status.  Paresthesia Patient endorses ongoing intermittent paresthesias.  Will check a vitamin B12 level along with her vitamin D  to assess for metabolic sources..    Return in about 1 year (around 05/12/2025) for CPE, Chronic f/u.     I discussed the assessment and treatment plan with the patient  The patient was provided an opportunity to ask questions  and all were answered. The patient agreed with the plan and demonstrated an understanding of the instructions.   The patient was advised to call back or seek an in-person evaluation if the symptoms worsen or if the condition fails to improve as anticipated.    Deanna LOISE BUOY, DO  [facility] [phone] (phone) [phone] (fax)  [facility]

## 2024-05-13 LAB — COMPREHENSIVE METABOLIC PANEL WITH GFR
ALT: 6 IU/L (ref 0–32)
AST: 10 IU/L (ref 0–40)
Albumin: 3.9 g/dL (ref 3.9–4.9)
Alkaline Phosphatase: 30 IU/L — ABNORMAL LOW (ref 41–116)
BUN/Creatinine Ratio: 13 (ref 9–23)
BUN: 10 mg/dL (ref 6–20)
Bilirubin Total: 0.3 mg/dL (ref 0.0–1.2)
CO2: 22 mmol/L (ref 20–29)
Calcium: 9.9 mg/dL (ref 8.7–10.2)
Chloride: 104 mmol/L (ref 96–106)
Creatinine, Ser: 0.79 mg/dL (ref 0.57–1.00)
Globulin, Total: 3.2 g/dL (ref 1.5–4.5)
Glucose: 79 mg/dL (ref 70–99)
Potassium: 4.9 mmol/L (ref 3.5–5.2)
Sodium: 140 mmol/L (ref 134–144)
Total Protein: 7.1 g/dL (ref 6.0–8.5)
eGFR: 100 mL/min/1.73 (ref >59)

## 2024-05-13 LAB — LIPID PANEL
Chol/HDL Ratio: 2.5 ratio (ref 0.0–4.4)
Cholesterol, Total: 217 mg/dL — ABNORMAL HIGH (ref 100–199)
HDL: 87 mg/dL (ref >39)
LDL Chol Calc (NIH): 115 mg/dL — ABNORMAL HIGH (ref 0–99)
Triglycerides: 84 mg/dL (ref 0–149)
VLDL Cholesterol Cal: 15 mg/dL (ref 5–40)

## 2024-05-13 LAB — VITAMIN D 25 HYDROXY (VIT D DEFICIENCY, FRACTURES): Vit D, 25-Hydroxy: 19.4 ng/mL — ABNORMAL LOW (ref 30.0–100.0)

## 2024-05-13 LAB — VITAMIN B12: Vitamin B-12: 298 pg/mL (ref 232–1245)

## 2024-05-22 ENCOUNTER — Ambulatory Visit: Payer: Self-pay | Admitting: Family Medicine
